# Patient Record
Sex: Male | Born: 1970 | Race: White | Hispanic: No | Marital: Married | Smoking: Never smoker
Health system: Southern US, Community
[De-identification: ages and names within clinical notes are randomized; demographics above are authoritative.]

## PROBLEM LIST (undated history)

## (undated) DIAGNOSIS — F329 Major depressive disorder, single episode, unspecified: Secondary | ICD-10-CM

## (undated) DIAGNOSIS — F32A Depression, unspecified: Secondary | ICD-10-CM

## (undated) DIAGNOSIS — E785 Hyperlipidemia, unspecified: Secondary | ICD-10-CM

## (undated) DIAGNOSIS — K5120 Ulcerative (chronic) proctitis without complications: Secondary | ICD-10-CM

## (undated) HISTORY — DX: Hyperlipidemia, unspecified: E78.5

## (undated) HISTORY — PX: COLONOSCOPY: SHX174

## (undated) HISTORY — DX: Major depressive disorder, single episode, unspecified: F32.9

## (undated) HISTORY — DX: Depression, unspecified: F32.A

## (undated) HISTORY — DX: Ulcerative (chronic) proctitis without complications: K51.20

---

## 2003-08-27 ENCOUNTER — Encounter (INDEPENDENT_AMBULATORY_CARE_PROVIDER_SITE_OTHER): Payer: Self-pay | Admitting: *Deleted

## 2003-08-27 ENCOUNTER — Ambulatory Visit (HOSPITAL_COMMUNITY): Admission: RE | Admit: 2003-08-27 | Discharge: 2003-08-27 | Payer: Self-pay | Admitting: *Deleted

## 2010-05-25 ENCOUNTER — Encounter (INDEPENDENT_AMBULATORY_CARE_PROVIDER_SITE_OTHER): Payer: Self-pay | Admitting: *Deleted

## 2010-07-11 ENCOUNTER — Ambulatory Visit: Payer: Self-pay | Admitting: Gastroenterology

## 2010-07-11 ENCOUNTER — Encounter (INDEPENDENT_AMBULATORY_CARE_PROVIDER_SITE_OTHER): Payer: Self-pay | Admitting: *Deleted

## 2010-07-11 DIAGNOSIS — K219 Gastro-esophageal reflux disease without esophagitis: Secondary | ICD-10-CM | POA: Insufficient documentation

## 2010-10-12 NOTE — Letter (Signed)
Summary: New Patient letter  [facility]  [address] [place], [place] [address]   Phone: [phone]  Fax: [phone]       05/25/2010 MRN: [id number]  [place], [place]  [address]  Dear Mr. [facility],  Welcome to the Gastroenterology Division at [facility]    You are scheduled to see Dr.  Owens Loffler on July 11, 2010 at 3:30pm on the 3rd floor at [facility] [place] [employer].  We ask that you try to arrive at our office 15 minutes prior to your appointment time to allow for check-in.  We would like you to complete the enclosed self-administered evaluation form prior to your visit and bring it with you on the day of your appointment.  We will review it with you.  Also, please bring a complete list of all your medications or, if you prefer, bring the medication bottles and we will list them.  Please bring your insurance card so that we may make a copy of it.  If your insurance requires a referral to see a specialist, please bring your referral form from your primary care physician.  Co-payments are due at the time of your visit and may be paid by cash, check or credit card.     Your office visit will consist of a consult with your physician (includes a physical exam), any laboratory testing he/she may order, scheduling of any necessary diagnostic testing (e.g. x-ray, ultrasound, CT-scan), and scheduling of a procedure (e.g. Endoscopy, Colonoscopy) if required.  Please allow enough time on your schedule to allow for any/all of these possibilities.    If you cannot keep your appointment, please call [phone] to cancel or reschedule prior to your appointment date.  This allows [place] the opportunity to schedule an appointment for another patient in need of care.  If you do not cancel or reschedule by 5 p.m. the business day prior to your appointment date, you will be charged a $50.00 late cancellation/no-show fee.    Thank  you for choosing [facility] for your medical needs.  We appreciate the opportunity to care for you.  Please visit [place] at our website  to learn more about our practice.                     Sincerely,                                                             The Gastroenterology Division

## 2010-10-12 NOTE — Assessment & Plan Note (Signed)
History of Present Illness Visit Type: Initial Visit Primary GI MD: Owens Loffler MD Primary Provider: Christella Noa, MD Chief Complaint: GERD History of Present Illness:      very pleasant 40 year old man who was told his dental enamel was wearing out and thinks that acid may be playing a role. He has NO pyrosis, does have intermittent dyusphagia.  Overall stable weight, maybe up a bit since stopping working out.  No signficant abd or chest pains.    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 He was put on suppository, enemas and his symptoms improved. He has not been on daily medicines for many years and his last flare was about a year ago, treated well with suppository.           Current Medications (verified): 1)  Fluoxetine Hcl 20 Mg Caps (Fluoxetine Hcl) .... Once Daily 2)  Metrogel 1 % Gel (Metronidazole) .... 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  Review of Systems       Pertinent positive and negative review of systems were noted in the above HPI and GI specific review of systems.  All other review of systems was otherwise negative.   Vital Signs:  Patient profile:   40 year old male Height:      75 inches Weight:      216.38 pounds BMI:     27.14 Pulse rate:   64 / minute Pulse rhythm:   regular BP sitting:   152 / 80  (left arm) Cuff size:   regular  Vitals Entered By: June McMurray [place] Deborra Medina) (July 11, 2010 3:27 PM)  Physical Exam  Additional Exam:  Constitutional: generally well appearing Psychiatric: alert and oriented times  3 Eyes: extraocular movements intact Mouth: oropharynx moist, no lesions Neck: supple, no lymphadenopathy Cardiovascular: heart regular rate and rythm Lungs: CTA bilaterally Abdomen: soft, non-tender, non-distended, no obvious ascites, no peritoneal signs, normal bowel sounds Extremities: no lower extremity edema bilaterally Skin: no lesions on visible extremities    Impression & Recommendations:  Problem # 1:  History of proctitis completely colitis and symptoms for least a year. He knows to call if he has a return of proctitis-like symptoms.  Problem # 2:  question GERD his dental enamel is even bloating and there is a question of whether acid could be causing this. He has no more classic acid symptoms at all. It seems unlikely that acid to be eroding his enamel and not creating any type of esophageal, throat, mouth symptoms however certainly silent acid regurgitation is known to occur and so I think we should proceed with EGD at his soonest convenience. His esophagus looks completely normal and I would recommend not starting any antiacid medicines unless more classic acid symptoms arise.  Patient Instructions: 1)  You will be scheduled to have an upper endoscopy. 2)  A copy of this information will be sent to Dr. Larena Glassman (dentist in [place]), Dr. Elesa Hacker. 3)  If proctitis symptoms return, call Dr. Ardis Hughs office. 4)  The medication list was reviewed and reconciled.  All changed /  newly prescribed medications were explained.  A complete medication list was provided to the patient / caregiver.  Appended Document:  patty can you send a copy to Dr. Trecia Rogers (dentist here in [place])  Appended Document: Orders Update/EGD    Clinical Lists Changes  Problems: Added new problem of ESOPHAGEAL REFLUX (ICD-530.81) Orders: Added new Test order of EGD (EGD) - Signed      Appended Document:  sent to transcription

## 2010-10-12 NOTE — Letter (Signed)
Summary: EGD Instructions  [facility]  [place],  [address]   Phone: [phone]  Fax: [phone]       Clio    [birth year]    MRN: [id number]       Procedure Day /Date:08/14/10  MON     Arrival Time:3 pm     Procedure Time:4 pm    Location of Procedure:                    [facility] (4th Floor)  PREPARATION FOR ENDOSCOPY   On12/5/11 THE DAY OF THE PROCEDURE:  1.   No solid foods, milk or milk products are allowed after midnight the night before your procedure.  2.   Do not drink anything colored red or purple.  Avoid juices with pulp.  No orange juice.  3.  You may drink clear liquids until 2 pm , which is 2 hours before your procedure.                                                                                                CLEAR LIQUIDS INCLUDE: Water Jello Ice Popsicles Tea (sugar ok, no milk/cream) Powdered fruit flavored drinks Coffee (sugar ok, no milk/cream) Gatorade Juice: apple, white grape, white cranberry  Lemonade Clear bullion, consomm, broth Carbonated beverages (any kind) Strained chicken noodle soup Hard Candy   MEDICATION INSTRUCTIONS  Unless otherwise instructed, you should take regular prescription medications with a small sip of water as early as possible the morning of your procedure.               OTHER INSTRUCTIONS  You will need a responsible adult at least 41 years of age to accompany you and drive you home.   This person must remain in the waiting room during your procedure.  Wear loose fitting clothing that is easily removed.  Leave jewelry and other valuables at home.  However, you may wish to bring a book to read or an iPod/MP3 player to listen to music as you wait for your procedure to start.  Remove all body piercing jewelry and leave at home.  Total time from sign-in until discharge is approximately 2-3 hours.  You should go home directly after your  procedure and rest.  You can resume normal activities the day after your procedure.  The day of your procedure you should not:   Drive   Make legal decisions   Operate machinery   Drink alcohol   Return to work  You will receive specific instructions about eating, activities and medications before you leave.    The above instructions have been reviewed and explained to me by   _______________________    I fully understand and can verbalize these instructions _____________________________ Date _________

## 2011-12-26 ENCOUNTER — Telehealth: Payer: Self-pay | Admitting: Gastroenterology

## 2011-12-26 NOTE — Telephone Encounter (Signed)
Pt has had bloody diarrhea for 1 month.  His PCP gave him rectal suppositories for ulcerative proctitis.  This has not helped.  Appt made with Amy [place] for 12/17/11 830 am

## 2011-12-27 ENCOUNTER — Other Ambulatory Visit (INDEPENDENT_AMBULATORY_CARE_PROVIDER_SITE_OTHER): Payer: BC Managed Care – PPO

## 2011-12-27 ENCOUNTER — Ambulatory Visit (INDEPENDENT_AMBULATORY_CARE_PROVIDER_SITE_OTHER): Payer: BC Managed Care – PPO | Admitting: Physician Assistant

## 2011-12-27 ENCOUNTER — Encounter: Payer: Self-pay | Admitting: Physician Assistant

## 2011-12-27 VITALS — BP 110/82 | HR 80 | Ht 75.0 in | Wt 213.1 lb

## 2011-12-27 DIAGNOSIS — K5130 Ulcerative (chronic) rectosigmoiditis without complications: Secondary | ICD-10-CM | POA: Insufficient documentation

## 2011-12-27 DIAGNOSIS — K5120 Ulcerative (chronic) proctitis without complications: Secondary | ICD-10-CM

## 2011-12-27 LAB — CBC WITH DIFFERENTIAL/PLATELET
Eosinophils Relative: 4.9 % (ref 0.0–5.0)
HCT: 45.7 % (ref 39.0–52.0)
Hemoglobin: 16 g/dL (ref 13.0–17.0)
Lymphs Abs: 1.6 10*3/uL (ref 0.7–4.0)
Monocytes Relative: 9.4 % (ref 3.0–12.0)
Neutro Abs: 4.1 10*3/uL (ref 1.4–7.7)
WBC: 6.7 10*3/uL (ref 4.5–10.5)

## 2011-12-27 MED ORDER — DICYCLOMINE HCL 10 MG PO CAPS: 10.0000 mg | ORAL_CAPSULE | Freq: Four times a day (QID) | ORAL | Status: DC

## 2011-12-27 MED ORDER — MESALAMINE 1.2 G PO TBEC: DELAYED_RELEASE_TABLET | ORAL | Status: DC

## 2011-12-27 MED ORDER — MESALAMINE 1000 MG RE SUPP: 1000.0000 mg | Freq: Two times a day (BID) | RECTAL | Status: DC

## 2011-12-27 MED ORDER — MOVIPREP 100 G PO SOLR: 1.0000 | Freq: Once | ORAL | Status: AC

## 2011-12-27 NOTE — Patient Instructions (Signed)
Please go to the basement level to have your labs drawn.   We scheduled the Colonoscopy with Dr. Ardis Hughs.. Directions provided. We sent prescriptions for Lialda, Canasa Suppositories, Bentyl for cramping and spasms and the colonoscopy prep to [facility] [address]>

## 2011-12-27 NOTE — Progress Notes (Addendum)
Subjective:    Patient ID: Daniel Bridges, male    DOB: 1971-04-24, 41 y.o.   MRN: [id number]  HPI Daniel Bridges is a pleasant 41 year old white male known to Dr. Ardis Hughs from evaluation in 2011 for reflux symptoms. He underwent upper endoscopy which was consistent with GERD. Patient also has history of ulcerative proctocolitis and had previously been treated by Dr. Vladimir Faster. He last had colonoscopy in 2004 and at that time had active proctitis with rectal biopsy showing findings consistent with ulcerative colitis; there were no well-formed crypt abscesses noted the findings were felt consistent with ulcerative proctocolitis. Patient has been treated over the years with oral  mesalamine and intermittent mesalamine suppositories. He had had quiescent disease over the past couple of years but says over the past 3-4 months he has developed recurrent symptoms, now progressively worse over the past 2 months. He has had some associated abdominal cramping, bloating and a lot of problems with tenesmus and urgency. At this time he is having a 10-12 bowel movements per day generally liquid stool with blood and mucus. He says over the past 2 weeks he hasn't really had any formed stool. He is also having  nighttime episodes of urgency and diarrhea. He says he may have a low-grade fever and has had occasional mild sweats. His appetite has been fair ,his weight is down about 5 pounds. He has no complaints of nausea or vomiting. He has been using his Canasa suppositories which his primary care physician refill once daily without any benefit thus far. He has not had any recent antibiotics.  His family history is positive for an ampulla colon cancer which was initially diagnosed in her 45s.    Review of Systems  Constitutional: Positive for unexpected weight change.  HENT: Negative.   Eyes: Negative.   Respiratory: Negative.   Cardiovascular: Negative.   Gastrointestinal: Positive for abdominal pain, diarrhea and blood  in stool.  Genitourinary: Negative.   Musculoskeletal: Negative.   Neurological: Negative.   Hematological: Negative.   Psychiatric/Behavioral: Negative.    No outpatient prescriptions prior to visit.   No Known Allergies     Patient Active Problem List  Diagnoses  . ESOPHAGEAL REFLUX  . Ulcerative proctocolitis    Objective:   Physical Exam well-developed white male in no acute distress, pleasant- blood pressure 110/80  pulse 80. HEENT; normocephalic. EOMI PERRLA sclera anicteric, Cardiovascular; regular rate and rhythm with S1-S2 no murmur gallop, Pulmonary; clear bilaterally, Abdomen; soft minimally tender in the left lower quadrant there is no guarding no rebound no palpable mass or hepatosplenomegaly bowel sounds are active, Rectal; exam not done, Extremities; no clubbing cyanosis or edema skin warm dry, Psych; mood and affect normal and appropriate        Assessment & Plan:  41 year old male with history of ulcerative proctocolitis recently with exacerbation progressive over the past 2 month now symptomatic with mild abdominal cramping, bloating, tenesmus and 10-12 bloody mucoid stools per day.  Plan; CBC  Start Lialda1.2 g, 2 tablets twice daily Increase Canasa suppositories to twice daily Add Bentyl 10 mg by mouth twice daily as needed for cramping and urgency Schedule for colonoscopy with Dr. Ardis Hughs within the next week or 2. Procedure is discussed in detail with the patient and he is agreeable to proceed.  He may benefit from a course of Uceris, however if  a colonoscopy can be done quickly will wait for colonoscopy findings.  Addendum: Reviewed and agree with management. May require steroids to induce remission,  but will await endoscopic impression.

## 2012-01-04 ENCOUNTER — Encounter: Payer: Self-pay | Admitting: Gastroenterology

## 2012-01-04 ENCOUNTER — Ambulatory Visit (AMBULATORY_SURGERY_CENTER): Payer: BC Managed Care – PPO | Admitting: Gastroenterology

## 2012-01-04 VITALS — BP 151/100 | HR 65 | Temp 97.6°F | Resp 15 | Ht 75.0 in | Wt 213.0 lb

## 2012-01-04 DIAGNOSIS — K5120 Ulcerative (chronic) proctitis without complications: Secondary | ICD-10-CM

## 2012-01-04 DIAGNOSIS — R197 Diarrhea, unspecified: Secondary | ICD-10-CM

## 2012-01-04 DIAGNOSIS — K626 Ulcer of anus and rectum: Secondary | ICD-10-CM

## 2012-01-04 DIAGNOSIS — K625 Hemorrhage of anus and rectum: Secondary | ICD-10-CM

## 2012-01-04 MED ORDER — PREDNISONE 10 MG PO TABS: 40.0000 mg | ORAL_TABLET | Freq: Every day | ORAL | Status: DC

## 2012-01-04 MED ORDER — MESALAMINE 4 G RE ENEM: 4.0000 g | ENEMA | Freq: Every day | RECTAL | Status: DC

## 2012-01-04 MED ORDER — SODIUM CHLORIDE 0.9 % IV SOLN: 500.0000 mL | INTRAVENOUS | Status: DC

## 2012-01-04 NOTE — Op Note (Signed)
[address] [place], [place]  [address]  COLONOSCOPY PROCEDURE REPORT  PATIENT:  Bridges, Daniel  MR#:  [id number] BIRTHDATE:  [birth year], 40 yrs. old  GENDER:  male ENDOSCOPIST:  Milus Banister, MD PROCEDURE DATE:  01/04/2012 PROCEDURE:  Colonoscopy with biopsy ASA CLASS:  Class II INDICATIONS:  ulcerative colitis (limited to rectosigmoid) 2004 Dr. Vladimir Faster, now with bloody diarrhea, urgency again, failing to respond to PO mesalamine, canasa MEDICATIONS:   Fentanyl 75 mcg IV, These medications were titrated to patient response per physician's verbal order, Versed 8 mg IV  DESCRIPTION OF PROCEDURE:   After the risks benefits and alternatives of the procedure were thoroughly explained, informed consent was obtained.  Digital rectal exam was performed and revealed no rectal masses.   The LB CF-[id number] endoscope was introduced through the anus and advanced to the cecum, which was identified by both the appendix and ileocecal valve, without limitations.  The quality of the prep was good..  The instrument was then slowly withdrawn as the colon was fully examined. <<PROCEDUREIMAGES>> FINDINGS: The rectosigmoid was severely inflamed, this tapered gradualy to normal mucosa at 25cm from anus. Biopsies taken and sent to pathology (jar 1) (see image1, image2, and image3).  This was otherwise a normal examination of the colon (see image4).  The terminal ileum appeared normal (see image5).   Retroflexed views in the rectum revealed no abnormalities. COMPLICATIONS:  None  ENDOSCOPIC IMPRESSION: 1) Recto-sigmoid severely inflamed to 25cm from anus.  Biopsies taken. 2) Otherwise normal examination 3) Normal terminal ileum  RECOMMENDATIONS: Please start prednisone 13m once daily. Continue on lialda 4 pills once daily. Stop canasa suppositories and instead use rowasa enema once nightly. Dr. JArdis Hughs office will call to set up return visit in  2-3 weeks.  ______________________________ [name] MD  n. eSIGNED:   DMilus Banisterat 01/04/2012 03:51 PM  MSteward Ros [phone]

## 2012-01-04 NOTE — Patient Instructions (Signed)
Discharge instructions given with verbal understanding. YOU HAD AN ENDOSCOPIC PROCEDURE TODAY AT THE [facility]: Refer to the procedure report that was given to you for any specific questions about what was found during the examination.  If the procedure report does not answer your questions, please call your gastroenterologist to clarify.  If you requested that your care partner not be given the details of your procedure findings, then the procedure report has been included in a sealed envelope for you to review at your convenience later.  YOU SHOULD EXPECT: Some feelings of bloating in the abdomen. Passage of more gas than usual.  Walking can help get rid of the air that was put into your GI tract during the procedure and reduce the bloating. If you had a lower endoscopy (such as a colonoscopy or flexible sigmoidoscopy) you may notice spotting of blood in your stool or on the toilet paper. If you underwent a bowel prep for your procedure, then you may not have a normal bowel movement for a few days.  DIET: Your first meal following the procedure should be a light meal and then it is ok to progress to your normal diet.  A half-sandwich or bowl of soup is an example of a good first meal.  Heavy or fried foods are harder to digest and may make you feel nauseous or bloated.  Likewise meals heavy in dairy and vegetables can cause extra gas to form and this can also increase the bloating.  Drink plenty of fluids but you should avoid alcoholic beverages for 24 hours.  ACTIVITY: Your care partner should take you home directly after the procedure.  You should plan to take it easy, moving slowly for the rest of the day.  You can resume normal activity the day after the procedure however you should NOT DRIVE or use heavy machinery for 24 hours (because of the sedation medicines used during the test).    SYMPTOMS TO REPORT IMMEDIATELY: A gastroenterologist can be reached at any hour.  During normal  business hours, 8:30 AM to 5:00 PM Monday through Friday, call [phone].  After hours and on weekends, please call the GI answering service at [phone] who will take a message and have the physician on call contact you.   Following lower endoscopy (colonoscopy or flexible sigmoidoscopy):  Excessive amounts of blood in the stool  Significant tenderness or worsening of abdominal pains  Swelling of the abdomen that is new, acute  Fever of 100F or higher  FOLLOW UP: If any biopsies were taken you will be contacted by phone or by letter within the next 1-3 weeks.  Call your gastroenterologist if you have not heard about the biopsies in 3 weeks.  Our staff will call the home number listed on your records the next business day following your procedure to check on you and address any questions or concerns that you may have at that time regarding the information given to you following your procedure. This is a courtesy call and so if there is no answer at the home number and we have not heard from you through the emergency physician on call, we will assume that you have returned to your regular daily activities without incident.  SIGNATURES/CONFIDENTIALITY: You and/or your care partner have signed paperwork which will be entered into your electronic medical record.  These signatures attest to the fact that that the information above on your After Visit Summary has been reviewed and is understood.  Full   responsibility of the confidentiality of this discharge information lies with you and/or your care-partner.  

## 2012-01-04 NOTE — Progress Notes (Signed)
Patient did not experience any of the following events: a burn prior to discharge; a fall within the facility; wrong site/side/patient/procedure/implant event; or a hospital transfer or hospital admission upon discharge from the facility. (G8907) Patient did not have preoperative order for IV antibiotic SSI prophylaxis. (G8918)  

## 2012-01-07 ENCOUNTER — Telehealth: Payer: Self-pay

## 2012-01-07 NOTE — Telephone Encounter (Signed)
  Follow up Call-  Call back number 01/04/2012  Post procedure Call Back phone  # [phone]  Permission to leave phone message Yes     Patient questions:  Do you have a fever, pain , or abdominal swelling? no Pain Score  0 *  Have you tolerated food without any problems? yes  Have you been able to return to your normal activities? yes  Do you have any questions about your discharge instructions: Diet   no Medications  no Follow up visit  no  Do you have questions or concerns about your Care? no  Actions: * If pain score is 4 or above: No action needed, pain <4.

## 2012-01-25 ENCOUNTER — Ambulatory Visit (INDEPENDENT_AMBULATORY_CARE_PROVIDER_SITE_OTHER): Payer: BC Managed Care – PPO | Admitting: Gastroenterology

## 2012-01-25 ENCOUNTER — Encounter: Payer: Self-pay | Admitting: Gastroenterology

## 2012-01-25 VITALS — BP 120/72 | HR 60 | Ht 75.0 in | Wt 213.0 lb

## 2012-01-25 DIAGNOSIS — K5120 Ulcerative (chronic) proctitis without complications: Secondary | ICD-10-CM

## 2012-01-25 NOTE — Patient Instructions (Signed)
Start decreasing the prednisone by 16m every week, until off completely.   Return to see Dr. JArdis Hughsin 6 weeks.   Continue nightly enemas and lialda (4 pills once a day).

## 2012-01-25 NOTE — Progress Notes (Signed)
Review of pertinent gastrointestinal problems: 1. left sided ulcerative colitis.  Dr. Vladimir Faster colonoscopy in 2004showed active proctitis with rectal biopsy showing findings consistent with ulcerative colitis; there were no well-formed crypt abscesses noted the findings were felt consistent with ulcerative proctocolitis. Patient has been treated over the years with oral mesalamine and intermittent mesalamine suppositories. Flared with bleeding, urgency, frequency Spring 2013.  Colonoscopy April 2013 showed moderate to severe inflammation up to 25 cm. Biopsies confirmed chronic inflammation. Terminal ileum was normal. He was started on 40 mg prednisone, mesalamine enema (continued on mesalamine orally).  May, 2013: Symptoms improved very quickly on prednisone.  HPI: This is a very pleasant 41 year old man whom I last saw the time of a colonoscopy about 3 weeks ago see those results summarized above.  Very quick improvement after he started prednisone 40 mg once daily.  Frequency is from 12 times a day down to 1-2 a day.  No bleeding.  Back to normal.  Eating a lot, insomnic with prednisone.  He is also on mesalamine enema, mesalamine orally.   Past Medical History  Diagnosis Date  . Ulcerative proctitis   . Hyperlipidemia   . Depression     No past surgical history on file.  Current Outpatient Prescriptions  Medication Sig Dispense Refill  . aspirin 81 MG tablet Take 81 mg by mouth daily.      Marland Kitchen dicyclomine (BENTYL) 10 MG capsule Take 1 capsule (10 mg total) by mouth 4 (four) times daily.  120 capsule  0  . FLUoxetine (PROZAC) 20 MG tablet Take 20 mg by mouth daily.      . mesalamine (LIALDA) 1.2 G EC tablet Take 2 tablets twice daily  60 tablet  3  . mesalamine (ROWASA) 4 G enema Place 60 mLs (4 g total) rectally at bedtime.  30 Bottle  3  . metroNIDAZOLE (METROGEL) 1 % gel Apply 1 application topically daily.      . Omega-3 Fatty Acids (FISH OIL) 1000 MG CAPS Take 1 capsule by mouth  daily.      . predniSONE (DELTASONE) 10 MG tablet As directed        Allergies as of 01/25/2012  . (No Known Allergies)    Family History  Problem Relation Age of Onset  . Colon cancer Neg Hx   . Kidney cancer Mother   . Hypertension Mother   . Aneurysm Father     abdominal aortic    History   Social History  . Marital Status: Married    Spouse Name: N/A    Number of Children: 2  . Years of Education: N/A   Occupational History  . Media planner    Social History Main Topics  . Smoking status: Never Smoker   . Smokeless tobacco: Never Used  . Alcohol Use: Yes     2 per day   . Drug Use: No  . Sexually Active: Not on file   Other Topics Concern  . Not on file   Social History Narrative  . No narrative on file      Physical Exam: BP 120/72  Pulse 60  Ht 6' 3"  (1.905 m)  Wt 213 lb (96.616 kg)  BMI 26.62 kg/m2 Constitutional: generally well-appearing Psychiatric: alert and oriented x3 Abdomen: soft, nontender, nondistended, no obvious ascites, no peritoneal signs, normal bowel sounds     Assessment and plan: 41 y.o. male with ulcerative proctocolitis  He is improving very well on prednisone 40 mg once daily, mesalamine enema, mesalamine  orally. He is going to begin tapering her prednisone by 5 mg every week. She will continue on both mesalamine products for now. He'll return to see me in 6 weeks and sooner if needed.

## 2012-02-20 ENCOUNTER — Other Ambulatory Visit: Payer: Self-pay

## 2012-02-20 MED ORDER — MESALAMINE 1.2 G PO TBEC: DELAYED_RELEASE_TABLET | ORAL | Status: DC

## 2012-03-05 ENCOUNTER — Encounter: Payer: Self-pay | Admitting: Gastroenterology

## 2012-03-05 ENCOUNTER — Ambulatory Visit (INDEPENDENT_AMBULATORY_CARE_PROVIDER_SITE_OTHER): Payer: BC Managed Care – PPO | Admitting: Gastroenterology

## 2012-03-05 VITALS — BP 126/72 | HR 80 | Ht 75.0 in | Wt 213.0 lb

## 2012-03-05 DIAGNOSIS — K5120 Ulcerative (chronic) proctitis without complications: Secondary | ICD-10-CM

## 2012-03-05 NOTE — Progress Notes (Signed)
Review of pertinent gastrointestinal problems:  1. left sided ulcerative colitis. Dr. Vladimir Faster colonoscopy in 2004showed active proctitis with rectal biopsy showing findings consistent with ulcerative colitis; there were no well-formed crypt abscesses noted the findings were felt consistent with ulcerative proctocolitis. Patient has been treated over the years with oral mesalamine and intermittent mesalamine suppositories. Flared with bleeding, urgency, frequency Spring 2013. Colonoscopy April 2013 showed moderate to severe inflammation up to 25 cm. Biopsies confirmed chronic inflammation. Terminal ileum was normal. He was started on 40 mg prednisone, mesalamine enema (continued on mesalamine orally). May, 2013: Symptoms improved very quickly on prednisone.   HPI: This is a  Very pleasant 41 yo man, I last saw him about 6 weeks ago. Since then he has been tapering his steroids. He continues on full dose mesalamine and nightly rectal mesalamine enemas.  No troubles taperintg steroids.  On 10 mg today, starting 5 tomorrow.     Past Medical History  Diagnosis Date  . Ulcerative proctitis   . Hyperlipidemia   . Depression     History reviewed. No pertinent past surgical history.  Current Outpatient Prescriptions  Medication Sig Dispense Refill  . aspirin 81 MG tablet Take 81 mg by mouth daily.      Marland Kitchen FLUoxetine (PROZAC) 20 MG tablet Take 20 mg by mouth daily.      . mesalamine (LIALDA) 1.2 G EC tablet Take 2 tablets twice daily  60 tablet  3  . mesalamine (ROWASA) 4 G enema Place 60 mLs (4 g total) rectally at bedtime.  30 Bottle  3  . metroNIDAZOLE (METROGEL) 1 % gel Apply 1 application topically daily.      . Omega-3 Fatty Acids (FISH OIL) 1000 MG CAPS Take 1 capsule by mouth daily.      . predniSONE (DELTASONE) 10 MG tablet As directed        Allergies as of 03/05/2012  . (No Known Allergies)    Family History  Problem Relation Age of Onset  . Colon cancer Neg Hx   . Kidney  cancer Mother   . Hypertension Mother   . Aneurysm Father     abdominal aortic    History   Social History  . Marital Status: Married    Spouse Name: N/A    Number of Children: 2  . Years of Education: N/A   Occupational History  . Media planner    Social History Main Topics  . Smoking status: Never Smoker   . Smokeless tobacco: Never Used  . Alcohol Use: Yes     2 per day   . Drug Use: No  . Sexually Active: Not on file   Other Topics Concern  . Not on file   Social History Narrative  . No narrative on file      Physical Exam: BP 126/72  Pulse 80  Ht 6' 3"  (1.905 m)  Wt 213 lb (96.616 kg)  BMI 26.62 kg/m2 Constitutional: generally well-appearing Psychiatric: alert and oriented x3 Abdomen: soft, nontender, nondistended, no obvious ascites, no peritoneal signs, normal bowel sounds     Assessment and plan: 41 y.o. male with ulcerative proctitis, left-sided colitis to 25 cm  He is doing well as he tapers off prednisone. He will maintain on mesalamine orally and rectally GERD we will begin taper his enemas in about 4-5 weeks and he'll return to see me in 2 months.

## 2012-03-05 NOTE — Patient Instructions (Addendum)
Continue tapering prednisone, should be off completely in 8 days. 4 weeks after off prednisone, then decrease enema to every other night. Return to see Dr. Ardis Hughs in 2 months. The goal is pills only, perhaps enemas every 3-4 nights or only as needed.

## 2012-03-11 ENCOUNTER — Other Ambulatory Visit: Payer: Self-pay | Admitting: Gastroenterology

## 2012-03-11 NOTE — Telephone Encounter (Signed)
Do you want to refill per your last note pt should be off Prednisone?

## 2012-06-18 ENCOUNTER — Other Ambulatory Visit: Payer: Self-pay | Admitting: Gastroenterology

## 2012-06-18 ENCOUNTER — Other Ambulatory Visit: Payer: Self-pay | Admitting: Physician Assistant

## 2012-11-20 ENCOUNTER — Other Ambulatory Visit: Payer: Self-pay | Admitting: Gastroenterology

## 2012-12-27 ENCOUNTER — Other Ambulatory Visit: Payer: Self-pay | Admitting: Gastroenterology

## 2013-02-06 ENCOUNTER — Other Ambulatory Visit: Payer: Self-pay | Admitting: Gastroenterology

## 2013-03-11 ENCOUNTER — Telehealth: Payer: Self-pay | Admitting: Gastroenterology

## 2013-03-11 ENCOUNTER — Other Ambulatory Visit: Payer: Self-pay | Admitting: Gastroenterology

## 2013-03-11 NOTE — Telephone Encounter (Signed)
Patient reports six weeks of worsening symptoms.  He has urgency and blood in stool.  He has been off of his Lialda for approx a week.  He will come in and see Tye Savoy RNP tomorrow at 1:30

## 2013-03-12 ENCOUNTER — Ambulatory Visit (INDEPENDENT_AMBULATORY_CARE_PROVIDER_SITE_OTHER): Payer: BC Managed Care – PPO | Admitting: Nurse Practitioner

## 2013-03-12 ENCOUNTER — Other Ambulatory Visit (INDEPENDENT_AMBULATORY_CARE_PROVIDER_SITE_OTHER): Payer: BC Managed Care – PPO

## 2013-03-12 ENCOUNTER — Encounter: Payer: Self-pay | Admitting: Nurse Practitioner

## 2013-03-12 VITALS — BP 120/80 | HR 68 | Ht 75.0 in | Wt 207.5 lb

## 2013-03-12 DIAGNOSIS — K5120 Ulcerative (chronic) proctitis without complications: Secondary | ICD-10-CM

## 2013-03-12 DIAGNOSIS — K51211 Ulcerative (chronic) proctitis with rectal bleeding: Secondary | ICD-10-CM

## 2013-03-12 LAB — CBC WITH DIFFERENTIAL/PLATELET
Basophils Relative: 0.1 % (ref 0.0–3.0)
Eosinophils Relative: 0 % (ref 0.0–5.0)
HCT: 47.9 % (ref 39.0–52.0)
Lymphs Abs: 1.1 10*3/uL (ref 0.7–4.0)
MCV: 84.7 fl (ref 78.0–100.0)
Monocytes Absolute: 0.1 10*3/uL (ref 0.1–1.0)
Monocytes Relative: 1 % — ABNORMAL LOW (ref 3.0–12.0)
RBC: 5.65 Mil/uL (ref 4.22–5.81)
WBC: 14.1 10*3/uL — ABNORMAL HIGH (ref 4.5–10.5)

## 2013-03-12 MED ORDER — MESALAMINE 1.2 G PO TBEC: 1200.0000 mg | DELAYED_RELEASE_TABLET | Freq: Two times a day (BID) | ORAL | Status: DC

## 2013-03-12 MED ORDER — BUDESONIDE 9 MG PO TB24: 1.0000 | ORAL_TABLET | ORAL | Status: DC

## 2013-03-12 NOTE — Patient Instructions (Addendum)
Your physician has requested that you go to the basement for the following lab work before leaving today: CBC, C Diff by PCR  We have sent the following medications to your pharmacy for you to pick up at your convenience: Brenda have been scheduled for a follow up with Dr Owens Loffler on Monday, 04/13/13 @ 9:00 am.  Cc: Dr Orpah Melter, Dr Owens Loffler

## 2013-03-12 NOTE — Progress Notes (Signed)
  History of Present Illness:  Patient is a 42 year old male with a history of left sided ulcerative colitis diagnosed 2004. Last colonoscopy late April 2013 showed severe inflammation in rectosigmoid colon tapering down to normal mucosa at 25 cm from the anus. Patient had been on oral mesalamine. Post colonoscopy he was started on mesalamine enemas and oral prednisone. At the time of his followup visit in mid may patient was feeling much better. His prednisone dose was tapered , he was kept on Lialda and nightly enemas. By late June patient was feeling fine prednisone. He was advised to continue oral mesalamine, his enemas were tapered over a 4-5 week period. Patient did well for several months but then around Christmas he began having recurrent bloody stools. Over the last two months the frequency of his loose bloody stools have increased . No abodminal pain, some bloating. Appetite okay. Ran out of Lialda two weeks ago. No antibiotics in months.   Current Medications, Allergies, Past Medical History, Past Surgical History, Family History and Social History were reviewed in [facility] record.  Physical Exam: General: Well developed , white male in no acute distress Head: Normocephalic and atraumatic Eyes:  sclerae anicteric, conjunctiva pink  Ears: Normal auditory acuity Lungs: Clear throughout to auscultation Heart: Regular rate and rhythm Abdomen: Soft, non distended, mild mid lower abdominal tenderness. No masses, no hepatomegaly. Normal bowel sounds Musculoskeletal: Symmetrical with no gross deformities  Extremities: No edema  Neurological: Alert oriented x 4, grossly nonfocal Psychological:  Alert and cooperative. Normal mood and affect  Assessment and Recommendations:  ulcerative proctitis, on Lialda 4.8 g daily. Now with increasing amounts of bloody stools over last 6 months. I don't know that adding back topical treatment will suffice at this point. Plan is to  obtain a CBC, rule out superimposed Cdiff, then start Uceris 37m daily (continue until follow up appointment). Patient will return to see his primary gastroenterologist, Dr. JArdis Hughs in 3-4 weeks. He knows to call in the interim should  symptoms not improve or become progressively worse

## 2013-03-14 NOTE — Progress Notes (Signed)
i agree with the plan outlined in this note

## 2013-03-16 ENCOUNTER — Other Ambulatory Visit: Payer: Self-pay | Admitting: *Deleted

## 2013-03-16 MED ORDER — MESALAMINE 1.2 G PO TBEC: DELAYED_RELEASE_TABLET | ORAL | Status: DC

## 2013-03-18 ENCOUNTER — Other Ambulatory Visit: Payer: BC Managed Care – PPO

## 2013-03-18 DIAGNOSIS — K51211 Ulcerative (chronic) proctitis with rectal bleeding: Secondary | ICD-10-CM

## 2013-03-19 LAB — CLOSTRIDIUM DIFFICILE BY PCR: Toxigenic C. Difficile by PCR: NOT DETECTED

## 2013-04-01 ENCOUNTER — Encounter: Payer: Self-pay | Admitting: Nurse Practitioner

## 2013-04-02 ENCOUNTER — Encounter: Payer: Self-pay | Admitting: Nurse Practitioner

## 2013-04-02 ENCOUNTER — Ambulatory Visit (INDEPENDENT_AMBULATORY_CARE_PROVIDER_SITE_OTHER): Payer: BC Managed Care – PPO | Admitting: Nurse Practitioner

## 2013-04-02 VITALS — BP 120/82 | HR 60 | Ht 75.0 in | Wt 213.6 lb

## 2013-04-02 DIAGNOSIS — K5120 Ulcerative (chronic) proctitis without complications: Secondary | ICD-10-CM

## 2013-04-02 DIAGNOSIS — K51211 Ulcerative (chronic) proctitis with rectal bleeding: Secondary | ICD-10-CM

## 2013-04-02 MED ORDER — PREDNISONE 20 MG PO TABS: 20.0000 mg | ORAL_TABLET | ORAL | Status: DC

## 2013-04-02 MED ORDER — MESALAMINE 4 G RE ENEM: 4.0000 g | ENEMA | Freq: Every day | RECTAL | Status: DC

## 2013-04-02 MED ORDER — ALPRAZOLAM 0.25 MG PO TABS: 0.2500 mg | ORAL_TABLET | Freq: Three times a day (TID) | ORAL | Status: DC | PRN

## 2013-04-02 NOTE — Patient Instructions (Signed)
Rowasa enema at night. Continue nightly until next appointment with Dr. Lurline Hare will be started on Prednisone. Take as directed below: Prednisone tapering schedule with 10 mg tablets:  40 mg (4 tablets) daily for 5 days followed by  35 mg (3 1/2 tablets) daily for 5 days followed by 30 mg (3 tablets) daily for 5 days followed by  25 mg (2 1/2 tablets) daily for 5 days followed by 20 mg (2 tablets) daily for 5 days followed by 10 mg (2 tablets) daily for 5 days followed by 5 mg  (1/2 tablet) daily for 5 days then discontinue.  You have been given a prescription for Xanax to take as needed for Prednisone side effects. Do not consume alcohol or operate machinery while taking this medication.   Continue current dose of Lialda  Keep follow appointment with Dr. Ardis Hughs.04/13/13 at 9:00am

## 2013-04-03 ENCOUNTER — Encounter: Payer: Self-pay | Admitting: Nurse Practitioner

## 2013-04-03 NOTE — Progress Notes (Signed)
  History of Present Illness:  Patient is a 42 year old male with a history of left sided ulcerative colitis maintained on lialda.  I saw him on the 3rd of this month for a several month history of bloody loose stools, progressive over the previous couple of months. C-diff returned negative. Patient was started on Uceris and his symptoms began to improve. Unfortunately the bloody diarrhea did not resolve and in fact has gotten worse of the last few days. No significant abdominal pain. No fevers. No arthralgia.   Current Medications, Allergies, Past Medical History, Past Surgical History, Family History and Social History were reviewed in [facility] record.  Physical Exam: General: Well developed , white male in no acute distress Head: Normocephalic and atraumatic Eyes:  sclerae anicteric, conjunctiva pink  Ears: Normal auditory acuity Lungs: Clear throughout to auscultation Heart: Regular rate and rhythm Abdomen: Soft, non distended, non-tender. No masses, no hepatomegaly. Normal bowel sounds Musculoskeletal: Symmetrical with no gross deformities  Extremities: No edema  Neurological: Alert oriented x 4, grossly nonfocal Psychological:  Alert and cooperative. Normal mood and affect  Assessment and Recommendations:  75. 42 year old male with ulcerative proctitis. Patient was doing well on Lialda until around Christmas time when he began having recurrent bloody diarrhea. Recent C. difficile is negative. Patient is having recurrent symptoms on the Euceris which was started 03/13/13. Overall patient looks well, abdominal exam is not concerning. Patient may need escalation of UC treatment in the near future but will defer that to patient's primary GI, Dr. Ardis Hughs. For now, will discontinue Euceris and begin slow prednisone taper starting at 97m and decreasing dose by 5 mg every 5 days. In addition, will add nightly rowasa enemas. Patient reports restlessness/ jitters with  prednisone. Will give him low dose Xanax to take as needed. Patient already has a follow up with Dr. JArdis Hughsearly next month.  He knows to call in the interim should symptoms worsen

## 2013-04-05 NOTE — Progress Notes (Signed)
I agree with this plan.

## 2013-04-06 NOTE — Telephone Encounter (Signed)
Error

## 2013-04-13 ENCOUNTER — Telehealth: Payer: Self-pay

## 2013-04-13 ENCOUNTER — Ambulatory Visit (INDEPENDENT_AMBULATORY_CARE_PROVIDER_SITE_OTHER): Payer: BC Managed Care – PPO | Admitting: Gastroenterology

## 2013-04-13 ENCOUNTER — Encounter: Payer: Self-pay | Admitting: Gastroenterology

## 2013-04-13 VITALS — BP 128/80 | HR 76 | Ht 75.0 in | Wt 213.2 lb

## 2013-04-13 DIAGNOSIS — K6289 Other specified diseases of anus and rectum: Secondary | ICD-10-CM

## 2013-04-13 MED ORDER — PREDNISONE 10 MG PO TABS: 10.0000 mg | ORAL_TABLET | ORAL | Status: DC

## 2013-04-13 MED ORDER — MESALAMINE 1.2 G PO TBEC: 4.8000 g | DELAYED_RELEASE_TABLET | Freq: Two times a day (BID) | ORAL | Status: DC

## 2013-04-13 MED ORDER — MESALAMINE 4 G RE ENEM: 4.0000 g | ENEMA | Freq: Every day | RECTAL | Status: DC

## 2013-04-13 NOTE — Telephone Encounter (Signed)
[facility] called to confirm Lialda 1.2 dosing:  Take 4 tablets twice a day per Christian Mate, CMA.

## 2013-04-13 NOTE — Progress Notes (Signed)
Review of pertinent gastrointestinal problems:  1. left sided ulcerative colitis. Dr. 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 Patient has been treated over the years with oral mesalamine and intermittent mesalamine suppositories. Flared with bleeding, urgency, frequency Spring 2013. Colonoscopy April 2013 showed moderate to severe inflammation up to 25 cm. Biopsies confirmed chronic inflammation. Terminal ileum was normal. He was started on 40 mg prednisone, mesalamine enema (continued on mesalamine orally). May, 2013: Symptoms improved very quickly on prednisone.  04/2013 Flare of symptoms beginning winder 2013/14; saw extender 03/2013; c. Diff negative, started Euceris, continued oral mesalamine; 3 weeks later symptoms a bit worse and put on prednisone 40 with taper, continue oral mesalamine, added rowasa.  HPI: This is a  very pleasant 42 year old man whom I last saw about a year ago. In the past month or so he has seen Nevin Bloodgood, one of our extender is here in the office. He has been having what is likely a flare of his left sided colitis.  He is doing much better.  Currently still on prednisone 19m per day. He is taking Rowasa enemas nightly. He is still on oral mesalamine full-strength. A bit more restless, more productive at home.  Not sleeping too well.  Since changing to prednisone his bleeding is gone.  Moving his bowels normally. No urgency at all.    Past Medical History  Diagnosis Date  . Ulcerative proctitis   . Hyperlipidemia   . Depression     History reviewed. No pertinent past surgical history.  Current Outpatient Prescriptions  Medication Sig Dispense Refill  . aspirin 81 MG tablet Take 81 mg by mouth daily.      .Marland KitchenFLUoxetine (PROZAC) 20 MG tablet Take 20 mg by mouth daily.      .  fluticasone (FLONASE) 50 MCG/ACT nasal spray       . mesalamine (LIALDA) 1.2 G EC tablet Take 2 tablets by mouth twice daily  120 tablet  1  . mesalamine (ROWASA) 4 G enema Place 60 mLs (4 g total) rectally at bedtime.  30 Bottle  1  . metroNIDAZOLE (METROGEL) 1 % gel Apply 1 application topically daily.      . predniSONE (DELTASONE) 20 MG tablet Take 1 tablet (20 mg total) by mouth as directed.  100 tablet  1   No current facility-administered medications for this visit.    Allergies as of 04/13/2013  . (No Known Allergies)    Family History  Problem Relation Age of Onset  . Colon cancer Neg Hx   . Kidney cancer Mother   . Hypertension Mother   . Aneurysm Father     abdominal aortic    History   Social History  . Marital Status: Married    Spouse Name: N/A    Number of Children: 2  . Years of Education: N/A   Occupational History  . tMedia planner   Social History Main Topics  . Smoking status: Never Smoker   . Smokeless tobacco: Never Used  . Alcohol Use: Yes     Comment: 2 per day   . Drug Use: No  . Sexually Active: Not on file   Other Topics Concern  . Not on file   Social History Narrative  . No narrative on file      Physical Exam: BP 128/80  Pulse 76  Ht  6' 3"  (1.905 m)  Wt 213 lb 4 oz (96.73 kg)  BMI 26.65 kg/m2 Constitutional: generally well-appearing Psychiatric: alert and oriented x3 Abdomen: soft, nontender, nondistended, no obvious ascites, no peritoneal signs, normal bowel sounds     Assessment and plan: 42 y.o. male with left-sided colitis, flaring  He will continue on prednisone but start tapering the medicine by 5 mg every week. He will continue on liadla full-strength as well as Rowasa enema nightly. My goal will be to get him off prednisone over the next one to 2 months, he will continue Rowasa enemas nightly for 2-3 months after that point and then I will try tapering his Rowasa enemas very slowly. All the while we'll continue  his mesalamine orally  full-strength. He'll return to see me in 2 months, sooner if needed

## 2013-04-13 NOTE — Patient Instructions (Addendum)
Start prednisone taper, by 15m every week. Please return to see Dr. JArdis Hughsin 2 months. Continue lialda 4 pills once daily (or two pills twice daily). Continue enemas nightly (longer term goal will to taper this very slowly, probably only to every third day). Refills for one year given.                                               We are excited to introduce MyChart, a new best-in-class service that provides you online access to important information in your electronic medical record. We want to make it easier for you to view your health information - all in one secure location - when and where you need it. We expect MyChart will enhance the quality of care and service we provide.  When you register for MyChart, you can:    View your test results.    Request appointments and receive appointment reminders via email.    Request medication renewals.    View your medical history, allergies, medications and immunizations.    Communicate with your physician's office through a password-protected site.    Conveniently print information such as your medication lists.  To find out if MyChart is right for you, please talk to a member of our clinical staff today. We will gladly answer your questions about this free health and wellness tool.  If you are age 5247or older and want a member of your family to have access to your record, you must provide written consent by completing a proxy form available at our office. Please speak to our clinical staff about guidelines regarding accounts for patients younger than age 43  As you activate your MyChart account and need any technical assistance, please call the MyChart technical support line at [phone] [phone] or email your question to [email or web address]. If you email your question(s), please include your name, a return phone number and the best time to reach you.  If you have non-urgent health-related questions, you can send a  message to our office through [email or web address] If you have a medical emergency, call 911.  Thank you for using MyChart as your new health and wellness resource!   MyChart licensed from [employer]  1999-2010. Patents Pending.

## 2013-05-13 ENCOUNTER — Encounter (HOSPITAL_COMMUNITY): Payer: Self-pay

## 2013-05-13 ENCOUNTER — Emergency Department (HOSPITAL_COMMUNITY)
Admission: EM | Admit: 2013-05-13 | Discharge: 2013-05-13 | Disposition: A | Discharge status: Home / Self Care | Payer: BC Managed Care – PPO | Attending: Emergency Medicine | Admitting: Emergency Medicine

## 2013-05-13 DIAGNOSIS — Z209 Contact with and (suspected) exposure to unspecified communicable disease: Secondary | ICD-10-CM

## 2013-05-13 DIAGNOSIS — K5120 Ulcerative (chronic) proctitis without complications: Secondary | ICD-10-CM | POA: Insufficient documentation

## 2013-05-13 DIAGNOSIS — Z7982 Long term (current) use of aspirin: Secondary | ICD-10-CM | POA: Insufficient documentation

## 2013-05-13 DIAGNOSIS — Z792 Long term (current) use of antibiotics: Secondary | ICD-10-CM | POA: Insufficient documentation

## 2013-05-13 DIAGNOSIS — F329 Major depressive disorder, single episode, unspecified: Secondary | ICD-10-CM | POA: Insufficient documentation

## 2013-05-13 DIAGNOSIS — Z8639 Personal history of other endocrine, nutritional and metabolic disease: Secondary | ICD-10-CM | POA: Insufficient documentation

## 2013-05-13 DIAGNOSIS — IMO0002 Reserved for concepts with insufficient information to code with codable children: Secondary | ICD-10-CM | POA: Insufficient documentation

## 2013-05-13 DIAGNOSIS — Z203 Contact with and (suspected) exposure to rabies: Secondary | ICD-10-CM | POA: Insufficient documentation

## 2013-05-13 DIAGNOSIS — F3289 Other specified depressive episodes: Secondary | ICD-10-CM | POA: Insufficient documentation

## 2013-05-13 DIAGNOSIS — Z862 Personal history of diseases of the blood and blood-forming organs and certain disorders involving the immune mechanism: Secondary | ICD-10-CM | POA: Insufficient documentation

## 2013-05-13 DIAGNOSIS — Z79899 Other long term (current) drug therapy: Secondary | ICD-10-CM | POA: Insufficient documentation

## 2013-05-13 MED ORDER — RABIES VACCINE, PCEC IM SUSR
1.0000 mL | Freq: Once | INTRAMUSCULAR | Status: AC
  Administered 2013-05-13: 1 mL via INTRAMUSCULAR
  Filled 2013-05-13: qty 1

## 2013-05-13 MED ORDER — RABIES IMMUNE GLOBULIN 150 UNIT/ML IM INJ
20.0000 [IU]/kg | INJECTION | Freq: Once | INTRAMUSCULAR | Status: AC
  Administered 2013-05-13: 1950 [IU] via INTRAMUSCULAR
  Filled 2013-05-13: qty 13

## 2013-05-13 NOTE — ED Provider Notes (Signed)
Medical screening examination/treatment/procedure(s) were performed by non-physician practitioner and as supervising physician I was immediately available for consultation/collaboration.  Leota Jacobsen, MD 05/13/13 2312

## 2013-05-13 NOTE — ED Notes (Addendum)
Pt asking for rabies vaccination after finding bats in house.  Sts bats were exterminated but were not tested for rabies.  [place] suggested getting the vaccine.

## 2013-05-13 NOTE — ED Provider Notes (Signed)
CSN: [id number]     Arrival date & time 05/13/13  1612 History  This chart was scribed for Daniel Bridges, [place], working with Daniel Jacobsen, MD by Daniel Bridges, ED Scribe. This patient was seen in room WTR8/WTR8 and the patient's care was started at 5:35 PM.    Chief Complaint  Patient presents with  . rabies vaccine     The history is provided by the patient and a significant other. No language interpreter was used.    HPI Comments: Daniel Bridges is a 42 y.o. male who presents to the Emergency Department due to a bat infestation in house over the weekend (3 days ago) and potential rabies exposure. The family was alerted to the bats by their golden retriever. The first bat was trapped by [employer] 3 days ago and was taken to [facility]. The second bat appeared the next night and was treated the same as the first. Upon calling Animal control testing today they denied having a record of the bats. The patient suspects the bats were never turned in for testing and does not know if the bats have rabies. The patient denies suspicion of any bites but the first bat had access to a sleeping quarter. Patient denies fever, chills, myalgia, vomiting, abdominal pain, cough or muscle spasms.    Past Medical History  Diagnosis Date  . Ulcerative proctitis   . Hyperlipidemia   . Depression    History reviewed. No pertinent past surgical history. Family History  Problem Relation Age of Onset  . Colon cancer Neg Hx   . Kidney cancer Mother   . Hypertension Mother   . Aneurysm Father     abdominal aortic   History  Substance Use Topics  . Smoking status: Never Smoker   . Smokeless tobacco: Never Used  . Alcohol Use: Yes     Comment: 2 per day     Review of Systems  Constitutional: Negative for fever and chills.  Gastrointestinal: Negative for nausea, vomiting and abdominal pain.  Musculoskeletal: Negative for myalgias.  Neurological: Negative for weakness and numbness.     Allergies  Review of patient's allergies indicates no known allergies.  Home Medications   Current Outpatient Rx  Name  Route  Sig  Dispense  Refill  . aspirin 81 MG tablet   Oral   Take 81 mg by mouth daily.         Marland Kitchen FLUoxetine (PROZAC) 20 MG tablet   Oral   Take 20 mg by mouth daily.         . fluticasone (FLONASE) 50 MCG/ACT nasal spray   Nasal   Place 2 sprays into the nose daily.          . mesalamine (LIALDA) 1.2 G EC tablet   Oral   Take 4.8 g by mouth daily.         . mesalamine (ROWASA) 4 G enema   Rectal   Place 60 mLs (4 g total) rectally at bedtime.   30 Bottle   11   . metroNIDAZOLE (METROGEL) 1 % gel   Topical   Apply 1 application topically daily.         . predniSONE (DELTASONE) 10 MG tablet   Oral   Take 1 tablet (10 mg total) by mouth as directed.   150 tablet   2    Triage Vitals: BP 170/94  Pulse 77  Temp(Src) 98.8 F (37.1 C) (Oral)  Resp 16  Ht 6'  3" (1.905 m)  Wt 212 lb (96.163 kg)  BMI 26.5 kg/m2  SpO2 97%  Physical Exam  Nursing note and vitals reviewed. Constitutional: He appears well-developed and well-nourished. No distress.  HENT:  Head: Normocephalic and atraumatic.  Eyes: Conjunctivae are normal.  Neck: Normal range of motion. Neck supple.  Pulmonary/Chest: Effort normal.  Musculoskeletal: Normal range of motion.  Neurological: He is alert. He exhibits normal muscle tone.  Skin: He is not diaphoretic.  Psychiatric: He has a normal mood and affect. His behavior is normal. Thought content normal.    ED Course  Procedures (including critical care time)  DIAGNOSTIC STUDIES: Oxygen Saturation is 97% on room air, normal by my interpretation.    COORDINATION OF CARE:  5:40 PM - Will order rabies vaccine and immune globulin injection. Patient verbalizes understanding and agrees with treatment plan.    Labs Review Labs Reviewed - No data to display Imaging Review No results found.  MDM   1.  Exposure to bat without known bite     Patient with multiple bats in the house over the weekend that had access to patient's sleeping quarters.  No known bite.  Bats were caught but not turned in to authorities.  Investigation is pending.  I discussed the case with the patient who agrees to immunoglobulin and vaccination.  Pt to follow up for further vaccinations with urgent care.  Information given regarding rabies, exposure, and medications given.  Family to also follow up with animal control.    I personally performed the services described in this documentation, which was scribed in my presence. The recorded information has been reviewed and is accurate.   Daniel Bibles, PA-C 05/13/13 [phone]

## 2013-05-16 ENCOUNTER — Emergency Department (INDEPENDENT_AMBULATORY_CARE_PROVIDER_SITE_OTHER)
Admission: EM | Admit: 2013-05-16 | Discharge: 2013-05-16 | Disposition: A | Discharge status: Home / Self Care | Payer: BC Managed Care – PPO | Source: Home / Self Care

## 2013-05-16 ENCOUNTER — Encounter (HOSPITAL_COMMUNITY): Payer: Self-pay | Admitting: Emergency Medicine

## 2013-05-16 DIAGNOSIS — Z203 Contact with and (suspected) exposure to rabies: Secondary | ICD-10-CM

## 2013-05-16 MED ORDER — RABIES VACCINE, PCEC IM SUSR
1.0000 mL | Freq: Once | INTRAMUSCULAR | Status: AC
  Administered 2013-05-16: 1 mL via INTRAMUSCULAR

## 2013-05-16 MED ORDER — RABIES VACCINE, PCEC IM SUSR
INTRAMUSCULAR | Status: AC
  Filled 2013-05-16: qty 1

## 2013-05-16 NOTE — ED Notes (Signed)
Pt is coming for 2nd series of Rabies Vaccination (Day 3) Exposure to bat only ... No bite reported Voices no new concerns.... Alert w/no signs of acute distress.

## 2013-05-20 ENCOUNTER — Encounter (HOSPITAL_COMMUNITY): Payer: Self-pay

## 2013-05-20 ENCOUNTER — Emergency Department (INDEPENDENT_AMBULATORY_CARE_PROVIDER_SITE_OTHER)
Admission: EM | Admit: 2013-05-20 | Discharge: 2013-05-20 | Disposition: A | Discharge status: Home / Self Care | Payer: BC Managed Care – PPO | Source: Home / Self Care

## 2013-05-20 DIAGNOSIS — Z203 Contact with and (suspected) exposure to rabies: Secondary | ICD-10-CM

## 2013-05-20 MED ORDER — RABIES VACCINE, PCEC IM SUSR
1.0000 mL | Freq: Once | INTRAMUSCULAR | Status: AC
  Administered 2013-05-20: 1 mL via INTRAMUSCULAR

## 2013-05-20 MED ORDER — RABIES VACCINE, PCEC IM SUSR
INTRAMUSCULAR | Status: AC
  Filled 2013-05-20: qty 1

## 2013-05-20 NOTE — ED Notes (Signed)
Here for day #7, 3 occurrence of rabies series

## 2013-05-27 ENCOUNTER — Encounter (HOSPITAL_COMMUNITY): Payer: Self-pay

## 2013-05-27 ENCOUNTER — Emergency Department (INDEPENDENT_AMBULATORY_CARE_PROVIDER_SITE_OTHER)
Admission: EM | Admit: 2013-05-27 | Discharge: 2013-05-27 | Disposition: A | Discharge status: Home / Self Care | Payer: BC Managed Care – PPO | Source: Home / Self Care

## 2013-05-27 DIAGNOSIS — Z203 Contact with and (suspected) exposure to rabies: Secondary | ICD-10-CM

## 2013-05-27 MED ORDER — RABIES VACCINE, PCEC IM SUSR
INTRAMUSCULAR | Status: AC
  Filled 2013-05-27: qty 1

## 2013-05-27 MED ORDER — RABIES VACCINE, PCEC IM SUSR
1.0000 mL | Freq: Once | INTRAMUSCULAR | Status: AC
  Administered 2013-05-27: 1 mL via INTRAMUSCULAR

## 2013-05-27 NOTE — ED Notes (Signed)
Here for final shot in series of rabies ; NAD

## 2013-06-19 ENCOUNTER — Encounter: Payer: Self-pay | Admitting: Gastroenterology

## 2013-06-19 ENCOUNTER — Ambulatory Visit (INDEPENDENT_AMBULATORY_CARE_PROVIDER_SITE_OTHER): Payer: BC Managed Care – PPO | Admitting: Gastroenterology

## 2013-06-19 VITALS — BP 126/68 | HR 72 | Ht 75.0 in | Wt 214.0 lb

## 2013-06-19 DIAGNOSIS — K5190 Ulcerative colitis, unspecified, without complications: Secondary | ICD-10-CM

## 2013-06-19 NOTE — Progress Notes (Signed)
Review of pertinent gastrointestinal problems:  1. left sided ulcerative colitis. Dr. 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 Patient has been treated over the years with oral mesalamine and intermittent mesalamine suppositories. Flared with bleeding, urgency, frequency Spring 2013. Colonoscopy April 2013 showed moderate to severe inflammation up to 25 cm. Biopsies confirmed chronic inflammation. Terminal ileum was normal. He was started on 40 mg prednisone, mesalamine enema (continued on mesalamine orally). May, 2013: Symptoms improved very quickly on prednisone. 04/2013 Flare of symptoms beginning winder 2013/14; saw extender 03/2013; c. Diff negative, started Euceris, continued oral mesalamine; 3 weeks later symptoms a bit worse and put on prednisone 40 with taper, continue oral mesalamine, added rowasa. 9/14 improving, prednisone tapering.   HPI: This is a  very pleasant 42 year old man whom I last saw about 2 months ago. He is doing very well.  Currently off pred for past 3 weeks.  Bowels are still normal.  He did have 3 episodes of discrete red bleeding, 2 weeks apart, no diarrhrea.  No diarrhea, no urgency.  Still on nightly rowasa enema   Past Medical History  Diagnosis Date  . Ulcerative proctitis   . Hyperlipidemia   . Depression     History reviewed. No pertinent past surgical history.  Current Outpatient Prescriptions  Medication Sig Dispense Refill  . aspirin 81 MG tablet Take 81 mg by mouth daily.      Marland Kitchen FLUoxetine (PROZAC) 20 MG tablet Take 20 mg by mouth daily.      . fluticasone (FLONASE) 50 MCG/ACT nasal spray Place 2 sprays into the nose daily.       . mesalamine (LIALDA) 1.2 G EC tablet Take 4.8 g by mouth daily.      . mesalamine (ROWASA) 4 G enema Place 60 mLs (4 g total)  rectally at bedtime.  30 Bottle  11  . metroNIDAZOLE (METROGEL) 1 % gel Apply 1 application topically daily.      . predniSONE (DELTASONE) 10 MG tablet Take 1 tablet (10 mg total) by mouth as directed.  150 tablet  2   No current facility-administered medications for this visit.    Allergies as of 06/19/2013  . (No Known Allergies)    Family History  Problem Relation Age of Onset  . Colon cancer Neg Hx   . Kidney cancer Mother   . Hypertension Mother   . Aneurysm Father     abdominal aortic    History   Social History  . Marital Status: Married    Spouse Name: N/A    Number of Children: 2  . Years of Education: N/A   Occupational History  . Media planner    Social History Main Topics  . Smoking status: Never Smoker   . Smokeless tobacco: Never Used  . Alcohol Use: Yes     Comment: 2 per day   . Drug Use: No  . Sexual Activity: Not on file   Other Topics Concern  . Not on file   Social History Narrative  . No narrative on file      Physical Exam: BP 126/68  Pulse 72  Ht 6' 3"  (1.905 m)  Wt 214 lb (97.07 kg)  BMI 26.75 kg/m2 Constitutional: generally well-appearing Psychiatric: alert and oriented x3 Abdomen: soft, nontender, nondistended, no obvious ascites, no peritoneal signs, normal bowel sounds  Assessment and plan: 42 y.o. male with left-sided ulcerative colitis  Dr. quite a while today to try to figure why he had this layer. He did have a slight interruption in his mesalamine but it was only for for 5 days. Other than that I can 0.2 no other risk factors for why he started flaring this past winter. Possibly the mesalamine product is just barely controlling his inflammation. I think he has another flare within the next one to 2 years without clear explanation outlined to increase him to immunomodulators. I would possibly want to look into his colon at that point but we will have to see how he does clinically. For now he'll stay on Rowasa enemas  nightly for another 2 months and then cut back to every other month. He will stay on his mesalamine full-strength daily. He will return to see me in 3-4 months and sooner if needed.

## 2013-06-19 NOTE — Patient Instructions (Signed)
Stay on lialda 4 pills once daily. Rowasa nightly for 8-10 more weeks, then every other night. Please return to see Dr. Ardis Hughs in 3-4 months, sooner if needed.

## 2013-07-16 ENCOUNTER — Other Ambulatory Visit: Payer: Self-pay

## 2013-10-09 ENCOUNTER — Telehealth: Payer: Self-pay | Admitting: Gastroenterology

## 2013-10-12 NOTE — Telephone Encounter (Signed)
Pt says his current medication regimen is not working for his UC flare, he is taking lialda and using  Rowasa enemas with minimal relief.  He was offered an appt today with extender but declined so appt was scheduled for 10/14/12 with Janett Billow he will call if symptoms worsen or go to the ER

## 2013-10-14 ENCOUNTER — Other Ambulatory Visit (INDEPENDENT_AMBULATORY_CARE_PROVIDER_SITE_OTHER): Payer: BC Managed Care – PPO

## 2013-10-14 ENCOUNTER — Encounter: Payer: Self-pay | Admitting: Gastroenterology

## 2013-10-14 ENCOUNTER — Ambulatory Visit (INDEPENDENT_AMBULATORY_CARE_PROVIDER_SITE_OTHER): Payer: BC Managed Care – PPO | Admitting: Gastroenterology

## 2013-10-14 VITALS — BP 128/82 | HR 76 | Ht 75.0 in | Wt 211.4 lb

## 2013-10-14 DIAGNOSIS — K5130 Ulcerative (chronic) rectosigmoiditis without complications: Secondary | ICD-10-CM

## 2013-10-14 DIAGNOSIS — K625 Hemorrhage of anus and rectum: Secondary | ICD-10-CM

## 2013-10-14 DIAGNOSIS — K5120 Ulcerative (chronic) proctitis without complications: Secondary | ICD-10-CM

## 2013-10-14 DIAGNOSIS — R197 Diarrhea, unspecified: Secondary | ICD-10-CM

## 2013-10-14 LAB — CBC WITH DIFFERENTIAL/PLATELET
BASOS ABS: 0.1 10*3/uL (ref 0.0–0.1)
Basophils Relative: 1 % (ref 0.0–3.0)
EOS ABS: 0.2 10*3/uL (ref 0.0–0.7)
Eosinophils Relative: 3.3 % (ref 0.0–5.0)
HCT: 50.8 % (ref 39.0–52.0)
Hemoglobin: 16.9 g/dL (ref 13.0–17.0)
LYMPHS ABS: 1.7 10*3/uL (ref 0.7–4.0)
LYMPHS PCT: 29.2 % (ref 12.0–46.0)
MCHC: 33.3 g/dL (ref 30.0–36.0)
MCV: 86.3 fl (ref 78.0–100.0)
MONOS PCT: 7.3 % (ref 3.0–12.0)
Monocytes Absolute: 0.4 10*3/uL (ref 0.1–1.0)
NEUTROS ABS: 3.4 10*3/uL (ref 1.4–7.7)
Neutrophils Relative %: 59.2 % (ref 43.0–77.0)
PLATELETS: 245 10*3/uL (ref 150.0–400.0)
RBC: 5.89 Mil/uL — ABNORMAL HIGH (ref 4.22–5.81)
RDW: 13.3 % (ref 11.5–14.6)
WBC: 5.8 10*3/uL (ref 4.5–10.5)

## 2013-10-14 LAB — COMPREHENSIVE METABOLIC PANEL
ALT: 23 U/L (ref 0–53)
AST: 27 U/L (ref 0–37)
Albumin: 4.5 g/dL (ref 3.5–5.2)
Alkaline Phosphatase: 66 U/L (ref 39–117)
BILIRUBIN TOTAL: 1.3 mg/dL — AB (ref 0.3–1.2)
BUN: 12 mg/dL (ref 6–23)
CHLORIDE: 102 meq/L (ref 96–112)
CO2: 27 mEq/L (ref 19–32)
CREATININE: 1 mg/dL (ref 0.4–1.5)
Calcium: 9.6 mg/dL (ref 8.4–10.5)
GFR: 83.72 mL/min (ref >60.00)
Glucose, Bld: 81 mg/dL (ref 70–99)
Potassium: 5.1 mEq/L (ref 3.5–5.1)
Sodium: 138 mEq/L (ref 135–145)
TOTAL PROTEIN: 7.6 g/dL (ref 6.0–8.3)

## 2013-10-14 LAB — SEDIMENTATION RATE: Sed Rate: 7 mm/hr (ref 0–22)

## 2013-10-14 LAB — HIGH SENSITIVITY CRP: CRP, High Sensitivity: 3.48 mg/L (ref 0.000–5.000)

## 2013-10-14 MED ORDER — PREDNISONE 20 MG PO TABS: 20.0000 mg | ORAL_TABLET | Freq: Two times a day (BID) | ORAL | Status: DC

## 2013-10-14 NOTE — Patient Instructions (Signed)
Please go to the basement level to have your labs drawn and stool studies. Take 2 tab with Lialda in the Am and 2 tab Lialda in the PM.    We made you a follow up appointment with Dr. Ardis Hughs on 11-06-2013 at 9:45 am.

## 2013-10-14 NOTE — Progress Notes (Signed)
i agree with the plan above 

## 2013-10-14 NOTE — Progress Notes (Signed)
10/14/2013 Daniel Bridges [id number] [birth year]   History of Present Illness:  This is a pleasant 43 year old male who is known to Dr. Ardis Hughs for treatment of his ulcerative proctocolitis. He was last seen in October 2014 at which time he was doing well after a prednisone treatment and taper in September. His last colonoscopy was in April 2013 which showed moderate to severe inflammation up to 25 cm and biopsies confirmed chronic inflammation.  He comes in today with complaints of typical flare. Says that he is having several bowel movements a day and there is always blood in his stool. This started in December before Christmas and at the time that  the flare began he was taking his Lialda 4.8 grams daily as well as Rowasa enemas at bedtime. He has gotten to the point where he stopped the enemas and has just been using them sporadically because they do not stay for long anyway since he is having frequent bowel movements. He has gotten some relief of his frequency with Imodium. He has responded well to prednisone in the past, but Uceris did not work for him.  At his visit with Dr. Ardis Hughs in October there was mention that if he had another flare within the next 1 to 2 years then they would possibly need to escalate therapy to immuno-modulators.   Current Medications, Allergies, Past Medical History, Past Surgical History, Family History and Social History were reviewed in [employer] [employer] record.   Physical Exam: BP 128/82  Pulse 76  Ht 6' 3"  (1.905 m)  Wt 211 lb 6.4 oz (95.89 kg)  BMI 26.42 kg/m2 General: Well developed white male in no acute distress Head: Normocephalic and atraumatic Eyes:  Sclerae anicteric, conjunctiva pink  Ears: Normal auditory acuity Lungs: Clear throughout to auscultation Heart: Regular rate and rhythm Abdomen: Soft, non-distended.  Normal bowel sounds.  Minimal discomfort in LLQ upon palpation of the abdomen. Musculoskeletal: Symmetrical with no  gross deformities  Extremities: No edema  Neurological: Alert oriented x 4, grossly non-focal Psychological:  Alert and cooperative. Normal mood and affect  Assessment and Recommendations: -Ulcerative proctocolitis:  Currently with a flare. We will check stool culture and stool for C. difficile. We'll also check labs including CBC, CMP, CRP, and sedimentation rate. Will also check a TPMT level in anticipation for need of escalation of therapy. He will continue his Lialda 4.8 grams daily, but will begin taking in divided doses with 2.4 g in the morning and 2.4 g in the evening. He has responded well to prednisone in the past so we'll start him on prednisone 20 mg twice a day. He'll continue on that dose of prednisone until he sees Dr. Ardis Hughs in followup in 3 weeks.

## 2013-10-23 ENCOUNTER — Emergency Department (HOSPITAL_COMMUNITY)
Admission: EM | Admit: 2013-10-23 | Discharge: 2013-10-23 | Disposition: A | Discharge status: Home / Self Care | Payer: BC Managed Care – PPO | Attending: Emergency Medicine | Admitting: Emergency Medicine

## 2013-10-23 ENCOUNTER — Emergency Department (HOSPITAL_COMMUNITY): Payer: BC Managed Care – PPO

## 2013-10-23 ENCOUNTER — Ambulatory Visit: Payer: BC Managed Care – PPO | Admitting: Gastroenterology

## 2013-10-23 ENCOUNTER — Encounter (HOSPITAL_COMMUNITY): Payer: Self-pay | Admitting: Emergency Medicine

## 2013-10-23 DIAGNOSIS — R059 Cough, unspecified: Secondary | ICD-10-CM | POA: Insufficient documentation

## 2013-10-23 DIAGNOSIS — IMO0002 Reserved for concepts with insufficient information to code with codable children: Secondary | ICD-10-CM | POA: Insufficient documentation

## 2013-10-23 DIAGNOSIS — Z79899 Other long term (current) drug therapy: Secondary | ICD-10-CM | POA: Insufficient documentation

## 2013-10-23 DIAGNOSIS — Z862 Personal history of diseases of the blood and blood-forming organs and certain disorders involving the immune mechanism: Secondary | ICD-10-CM | POA: Insufficient documentation

## 2013-10-23 DIAGNOSIS — Y93B2 Activity, push-ups, pull-ups, sit-ups: Secondary | ICD-10-CM | POA: Insufficient documentation

## 2013-10-23 DIAGNOSIS — R209 Unspecified disturbances of skin sensation: Secondary | ICD-10-CM | POA: Insufficient documentation

## 2013-10-23 DIAGNOSIS — F329 Major depressive disorder, single episode, unspecified: Secondary | ICD-10-CM | POA: Insufficient documentation

## 2013-10-23 DIAGNOSIS — R05 Cough: Secondary | ICD-10-CM | POA: Insufficient documentation

## 2013-10-23 DIAGNOSIS — R42 Dizziness and giddiness: Secondary | ICD-10-CM | POA: Insufficient documentation

## 2013-10-23 DIAGNOSIS — Z8719 Personal history of other diseases of the digestive system: Secondary | ICD-10-CM | POA: Insufficient documentation

## 2013-10-23 DIAGNOSIS — Y929 Unspecified place or not applicable: Secondary | ICD-10-CM | POA: Insufficient documentation

## 2013-10-23 DIAGNOSIS — F3289 Other specified depressive episodes: Secondary | ICD-10-CM | POA: Insufficient documentation

## 2013-10-23 DIAGNOSIS — R0602 Shortness of breath: Secondary | ICD-10-CM | POA: Insufficient documentation

## 2013-10-23 DIAGNOSIS — R110 Nausea: Secondary | ICD-10-CM

## 2013-10-23 DIAGNOSIS — R61 Generalized hyperhidrosis: Secondary | ICD-10-CM | POA: Insufficient documentation

## 2013-10-23 DIAGNOSIS — X500XXA Overexertion from strenuous movement or load, initial encounter: Secondary | ICD-10-CM | POA: Insufficient documentation

## 2013-10-23 DIAGNOSIS — J3489 Other specified disorders of nose and nasal sinuses: Secondary | ICD-10-CM | POA: Insufficient documentation

## 2013-10-23 DIAGNOSIS — Z8639 Personal history of other endocrine, nutritional and metabolic disease: Secondary | ICD-10-CM | POA: Insufficient documentation

## 2013-10-23 LAB — CBC WITH DIFFERENTIAL/PLATELET
BASOS ABS: 0 10*3/uL (ref 0.0–0.1)
Basophils Relative: 0 % (ref 0–1)
Eosinophils Absolute: 0 10*3/uL (ref 0.0–0.7)
Eosinophils Relative: 0 % (ref 0–5)
HEMATOCRIT: 42.6 % (ref 39.0–52.0)
Hemoglobin: 15.6 g/dL (ref 13.0–17.0)
LYMPHS ABS: 1.7 10*3/uL (ref 0.7–4.0)
LYMPHS PCT: 18 % (ref 12–46)
MCH: 29.5 pg (ref 26.0–34.0)
MCHC: 36.6 g/dL — ABNORMAL HIGH (ref 30.0–36.0)
MCV: 80.7 fL (ref 78.0–100.0)
MONO ABS: 0.4 10*3/uL (ref 0.1–1.0)
Monocytes Relative: 4 % (ref 3–12)
NEUTROS ABS: 7.3 10*3/uL (ref 1.7–7.7)
Neutrophils Relative %: 77 % (ref 43–77)
Platelets: 271 10*3/uL (ref 150–400)
RBC: 5.28 MIL/uL (ref 4.22–5.81)
RDW: 13 % (ref 11.5–15.5)
WBC: 9.4 10*3/uL (ref 4.0–10.5)

## 2013-10-23 LAB — URINALYSIS, ROUTINE W REFLEX MICROSCOPIC
BILIRUBIN URINE: NEGATIVE
Glucose, UA: NEGATIVE mg/dL
Hgb urine dipstick: NEGATIVE
Ketones, ur: NEGATIVE mg/dL
LEUKOCYTES UA: NEGATIVE
Nitrite: NEGATIVE
PH: 6.5 (ref 5.0–8.0)
Protein, ur: NEGATIVE mg/dL
SPECIFIC GRAVITY, URINE: 1.018 (ref 1.005–1.030)
Urobilinogen, UA: 0.2 mg/dL (ref 0.0–1.0)

## 2013-10-23 LAB — COMPREHENSIVE METABOLIC PANEL
ALT: 28 U/L (ref 0–53)
AST: 25 U/L (ref 0–37)
Albumin: 4.1 g/dL (ref 3.5–5.2)
Alkaline Phosphatase: 68 U/L (ref 39–117)
BUN: 14 mg/dL (ref 6–23)
CALCIUM: 9.4 mg/dL (ref 8.4–10.5)
CO2: 22 mEq/L (ref 19–32)
CREATININE: 1.03 mg/dL (ref 0.50–1.35)
Chloride: 98 mEq/L (ref 96–112)
GFR calc non Af Amer: 87 mL/min — ABNORMAL LOW (ref >90)
GLUCOSE: 124 mg/dL — AB (ref 70–99)
Potassium: 4.5 mEq/L (ref 3.7–5.3)
Sodium: 136 mEq/L — ABNORMAL LOW (ref 137–147)
Total Bilirubin: 0.4 mg/dL (ref 0.3–1.2)
Total Protein: 7.5 g/dL (ref 6.0–8.3)

## 2013-10-23 LAB — POCT I-STAT TROPONIN I
TROPONIN I, POC: 0 ng/mL (ref 0.00–0.08)
Troponin i, poc: 0 ng/mL (ref 0.00–0.08)

## 2013-10-23 MED ORDER — SODIUM CHLORIDE 0.9 % IV BOLUS (SEPSIS)
1000.0000 mL | Freq: Once | INTRAVENOUS | Status: AC
  Administered 2013-10-23: 1000 mL via INTRAVENOUS

## 2013-10-23 NOTE — ED Notes (Addendum)
Presents with sudden onset of left sided flank pain assocaited with diaphoresis, nausea, SOB and pale skin color, near syncopal episode. Pain began while driving, nothing made pain worse, nothing made pain better. Pain went away on own in 15 minutes. Did not radiate. [facility] EKG showed RBBB and elevation in V2 which resolved when pain ceased. Pt denies pain at this time.  Reports dizziness. Skin warm dry intact. Reports mild SOB, bilateral breath sounds clear. +2 radial pulses.  Recently started on prednisone 2 weeks ago.  GIven 324 of ASA inroute

## 2013-10-23 NOTE — ED Provider Notes (Signed)
CSN: [id number]     Arrival date & time 10/23/13  1744 History   First MD Initiated Contact with Patient 10/23/13 1754     Chief Complaint  Patient presents with  . Flank Pain     (Consider location/radiation/quality/duration/timing/severity/associated sxs/prior Treatment) Patient is a 43 y.o. male presenting with flank pain.  Flank Pain Associated symptoms include congestion, coughing and nausea. Pertinent negatives include no abdominal pain, arthralgias, chest pain, chills, fever, neck pain, sore throat or vomiting.   43 yo male presents with LEFT lower back pain that started while he was working out doing "lat pull downs". Pain described as burning, squeezing pain that was constant for about 20 mniutes and then resolved. Patient states pain rated at 7/10. Patient was driving home when he became diaphoretic, lightheaded, and patient describes bilateral arm paresthesias. No Chest pain. Admits to a little SOB with episode that has since improved. Patient states he had "a bit of a cold recently" since Tuesday. PMH significant for ulcerative proctitis and hyperlipidemia for which patient is currently controlling with lifestyle modifications. Patient admits to recent flare of proctitis for which he is currently on prednisone for.  Patient denies any additional cardiac hx.  Advanced age > 59 yo: No HTN: No Hyperlipidemia: Yes Cigarette smoking: No Diabetes Mellitus: No Family hx of CAD or MI < 72 yo : No Male or Post menopausal: Yes Cocaine use: No Prior MI: No CABG: No Stress test: No Angina: No   Past Medical History  Diagnosis Date  . Ulcerative proctitis   . Hyperlipidemia   . Depression    History reviewed. No pertinent past surgical history. Family History  Problem Relation Age of Onset  . Colon cancer Neg Hx   . Kidney cancer Mother   . Hypertension Mother   . Aneurysm Father     abdominal aortic   History  Substance Use Topics  . Smoking status: Never Smoker   .  Smokeless tobacco: Never Used  . Alcohol Use: Yes     Comment: 2 per day     Review of Systems  Constitutional: Negative for fever and chills.  HENT: Positive for congestion, rhinorrhea and sneezing. Negative for ear discharge, ear pain, sinus pressure and sore throat.   Eyes: Negative for visual disturbance.  Respiratory: Positive for cough and shortness of breath. Negative for chest tightness.   Cardiovascular: Negative for chest pain.  Gastrointestinal: Positive for nausea. Negative for vomiting, abdominal pain and diarrhea.  Genitourinary: Positive for flank pain.  Musculoskeletal: Negative for arthralgias, neck pain and neck stiffness.  All other systems reviewed and are negative.      Allergies  Review of patient's allergies indicates no known allergies.  Home Medications   Current Outpatient Rx  Name  Route  Sig  Dispense  Refill  . FLUoxetine (PROZAC) 20 MG tablet   Oral   Take 20 mg by mouth daily.         . fluticasone (FLONASE) 50 MCG/ACT nasal spray   Nasal   Place 2 sprays into the nose daily as needed for allergies.          . Homeopathic Products (ZICAM COLD REMEDY PO)   Oral   Take 1 tablet by mouth daily as needed (for cold).         . mesalamine (LIALDA) 1.2 G EC tablet   Oral   Take 4.8 g by mouth daily.         . Multiple Vitamin (MULTIVITAMIN WITH  MINERALS) TABS tablet   Oral   Take 1 tablet by mouth daily.         . predniSONE (DELTASONE) 20 MG tablet   Oral   Take 1 tablet (20 mg total) by mouth 2 (two) times daily with a meal.   60 tablet   0    BP 128/73  Pulse 64  Temp(Src) 97.9 F (36.6 C) (Oral)  Resp 18  SpO2 100% Physical Exam  Nursing note and vitals reviewed. Constitutional: He is oriented to person, place, and time. He appears well-developed and well-nourished. No distress.  HENT:  Head: Normocephalic and atraumatic.  Mouth/Throat: Uvula is midline, oropharynx is clear and moist and mucous membranes are  normal.  Eyes: Conjunctivae and EOM are normal. Pupils are equal, round, and reactive to light.  Neck: Trachea normal and phonation normal. Neck supple. No JVD present. Carotid bruit is not present. No tracheal deviation present.  Cardiovascular: Normal rate, regular rhythm and intact distal pulses.  Exam reveals no gallop and no friction rub.   No murmur heard. Pulmonary/Chest: Effort normal and breath sounds normal. Not tachypneic. No respiratory distress. He has no decreased breath sounds. He has no wheezes. He has no rhonchi. He has no rales.  Abdominal: Soft. Normal appearance and bowel sounds are normal. He exhibits no distension. There is no tenderness. There is no rigidity, no guarding, no tenderness at McBurney's point and negative Murphy's sign.  Musculoskeletal: Normal range of motion. He exhibits no edema.  No midline spinal tenderness.  No CVA tenderness.  No paraspinal tenderness.  No obvious spasm.   Neurological: He is alert and oriented to person, place, and time. He has normal strength. No cranial nerve deficit or sensory deficit. Coordination and gait normal.  Reflex Scores:      Bicep reflexes are 2+ on the right side and 2+ on the left side.      Patellar reflexes are 2+ on the right side and 2+ on the left side. CN II-XII grossly intact. Cerebellar function appears intact with finger to nose exam. Patient ambulates in room without assistance.    Skin: Skin is warm and dry. He is not diaphoretic.  Psychiatric: He has a normal mood and affect. His behavior is normal.    ED Course  Procedures (including critical care time) Labs Review Labs Reviewed  COMPREHENSIVE METABOLIC PANEL - Abnormal; Notable for the following:    Sodium 136 (*)    Glucose, Bld 124 (*)    GFR calc non Af Amer 87 (*)    All other components within normal limits  CBC WITH DIFFERENTIAL - Abnormal; Notable for the following:    MCHC 36.6 (*)    All other components within normal limits   URINALYSIS, ROUTINE W REFLEX MICROSCOPIC  POCT I-STAT TROPONIN I  POCT I-STAT TROPONIN I   Imaging Review Dg Chest 2 View  10/23/2013   CLINICAL DATA:  Left upper back pain.  EXAM: CHEST  2 VIEW  COMPARISON:  No priors.  FINDINGS: Lung volumes are normal. No consolidative airspace disease. No pleural effusions. No pneumothorax. No pulmonary nodule or mass noted. Pulmonary vasculature and the cardiomediastinal silhouette are within normal limits.  IMPRESSION: 1.  No radiographic evidence of acute cardiopulmonary disease.   Electronically Signed   By: Vinnie Langton M.D.   On: 10/23/2013 20:51    EKG Interpretation    Date/Time:  Friday October 23 2013 18:41:24 EST Ventricular Rate:  57 PR Interval:  142 QRS Duration:  113 QT Interval:  423 QTC Calculation: 412 R Axis:   50 Text Interpretation:  Sinus rhythm Borderline intraventricular conduction delay RSR' in V1 or V2, probably normal variant ST elev, probable normal early repol pattern Confirmed by WARD  DO, KRISTEN ([id number]) on 10/23/2013 6:59:06 PM            MDM   Final diagnoses:  Dizziness  Nausea  Shortness of breath   Afebrile with normal VS.  CXR negative. No widened mediastinum or acute abnormalities EKG shows no signs of ischemia Delta troponin negative. Patient minimal risk for MI. CMP - WNL CBC - WNL UA WNL  Patient in NAD. States he is feeling well and ready for discharge. Patient appears stable for discharge. Discussed labs, and exam findings with patient. Advised follow up with PCP in 2 days.  Recommend return to ED should patient symptoms return, develop chest pain, or shortness of breath. Patient agrees with plan. Discharged in good condition.     Meds given in ED:  Medications  sodium chloride 0.9 % bolus 1,000 mL (0 mLs Intravenous Stopped 10/23/13 2205)    Discharge Medication List as of 10/23/2013 11:47 PM       Sherrie George, PA-C 10/24/13 1144

## 2013-10-23 NOTE — Discharge Instructions (Signed)
Follow up with your primary care doctor in 2 days for follow up. Return to ED should you develop any worsening symptoms, progressive shortness of breath, feeling like your about to pass out, or develop chest pain.   Dizziness  Dizziness means you feel unsteady or lightheaded. You might feel like you are going to pass out (faint). HOME CARE   Drink enough fluids to keep your pee (urine) clear or pale yellow.  Take your medicines exactly as told by your doctor. If you take blood pressure medicine, always stand up slowly from the lying or sitting position. Hold on to something to steady yourself.  If you need to stand in one place for a long time, move your legs often. Tighten and relax your leg muscles.  Have someone stay with you until you feel okay.  Do not drive or use heavy machinery if you feel dizzy.  Do not drink alcohol. GET HELP RIGHT AWAY IF:   You feel dizzy or lightheaded and it gets worse.  You feel sick to your stomach (nauseous), or you throw up (vomit).  You have trouble talking or walking.  You feel weak or have trouble using your arms, hands, or legs.  You cannot think clearly or have trouble forming sentences.  You have chest pain, belly (abdominal) pain, sweating, or you are short of breath.  Your vision changes.  You are bleeding.  You have problems from your medicine that seem to be getting worse. MAKE SURE YOU:   Understand these instructions.  Will watch your condition.  Will get help right away if you are not doing well or get worse. Document Released: 08/16/2011 Document Revised: 11/19/2011 Document Reviewed: 08/16/2011 [facility] Patient Information 2014 [place], [place].

## 2013-10-23 NOTE — ED Provider Notes (Signed)
Medical screening examination/treatment/procedure(s) were conducted as a shared visit with non-physician practitioner(s) and myself.  I personally evaluated the patient during the encounter.  EKG Interpretation    Date/Time:  Friday October 23 2013 18:41:24 EST Ventricular Rate:  57 PR Interval:  142 QRS Duration: 113 QT Interval:  423 QTC Calculation: 412 R Axis:   50 Text Interpretation:  Sinus rhythm Borderline intraventricular conduction delay RSR' in V1 or V2, probably normal variant ST elev, probable normal early repol pattern Confirmed by Anairis Knick  DO, Graziella Connery ([id number]) on 10/23/2013 6:59:06 PM            Patient is a 43 year old male with history of hyperlipidemia who presents emergency department with left lower back pain that started while doing a workout today. He desires the pain is sharp and cramping in the last approximately 20-30 minutes. While driving him he began to feel short of breath, nauseous and dizzy and felt like he was going to pass out had to pull over his car. He denies that he's had any chest pain. He reports that he is now back to baseline. Denies any recent vomiting but has had diarrhea as he has a history of ulcerative proctitis. He is currently on prednisone for this. No bloody stools or melena. No numbness currently or focal weakness. No headache. No history of hypertension, diabetes, tobacco abuse, CAD or CHF, atrial fibrillation, CVA. No family history of any cardiac disease. This episode occurred approximately 5 PM today. Patient reports he is normally able to exercise without any symptoms and he is a very active man. I feel this is unlikely arrhythmia or ACS given his minimal risk factors. He is neurologically intact. Hemodynamically stable. Will give IV fluids for possible dehydration. His initial troponin is negative. His EKG shows J-point elevation but no ischemic changes. Will repeat troponin at 11 PM, 6 hours after the onset of symptoms. If negative and patient  is still feeling well, anticipate he can be discharged home. He has a PCP for followup. He is comfortable with this plan and would prefer discharge over admission.   Second troponin negative. Pt is still asymptomatic.  Will dc home.  [place], DO 10/23/13 2345

## 2013-10-26 LAB — THIOPURINE METHYLTRANSFERASE (TPMT), RBC: Thiopurine Methyltransferase, RBC: 10 — ABNORMAL LOW (ref >12)

## 2013-11-06 ENCOUNTER — Encounter: Payer: Self-pay | Admitting: Gastroenterology

## 2013-11-06 ENCOUNTER — Ambulatory Visit (INDEPENDENT_AMBULATORY_CARE_PROVIDER_SITE_OTHER): Payer: BC Managed Care – PPO | Admitting: Gastroenterology

## 2013-11-06 VITALS — BP 152/90 | HR 80 | Ht 75.0 in | Wt 219.2 lb

## 2013-11-06 DIAGNOSIS — K529 Noninfective gastroenteritis and colitis, unspecified: Secondary | ICD-10-CM

## 2013-11-06 DIAGNOSIS — K5289 Other specified noninfective gastroenteritis and colitis: Secondary | ICD-10-CM

## 2013-11-06 MED ORDER — MOVIPREP 100 G PO SOLR: 1.0000 | Freq: Once | ORAL | Status: DC

## 2013-11-06 MED ORDER — AZATHIOPRINE 50 MG PO TABS: 100.0000 mg | ORAL_TABLET | Freq: Every day | ORAL | Status: DC

## 2013-11-06 NOTE — Patient Instructions (Signed)
You will be set up for a colonoscopy ([place]) for colitis, ? Progression of disease. Start azathiaprine 130m once daily.  New script called in. CBC and cmet in 1 week. Stay on prednisone 466mper day and lialda 2 pills twice daily. Please turn the stool samples.

## 2013-11-06 NOTE — Progress Notes (Signed)
Review of pertinent gastrointestinal problems:  1. left sided ulcerative colitis. Dr. 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 Patient has been treated over the years with oral mesalamine and intermittent mesalamine suppositories. Flared with bleeding, urgency, frequency Spring 2013. Colonoscopy April 2013 showed moderate to severe inflammation up to 25 cm. Biopsies confirmed chronic inflammation. Terminal ileum was normal. He was started on 40 mg prednisone, mesalamine enema (continued on mesalamine orally). May, 2013: Symptoms improved very quickly on prednisone. 04/2013 Flare of symptoms beginning winder 2013/14; saw extender 03/2013; c. Diff negative, started Euceris, continued oral mesalamine; 3 weeks later symptoms a bit worse and put on prednisone 40 with taper, continue oral mesalamine, added rowasa. 9/14 improving, prednisone tapering. 09/2013 flaring again   HPI: This is a   very pleasant 43 year old man whom I last saw several months ago. He was seen at our office by Janett Billow about one month ago.  Sedimentation rate, blood counts were normal. He did not drop off stool samples yet.  He has been on prednisone 40 mg per day since that visit which was about a month ago.  Up to 60m twice daily prednisone.  Still intermittent diarrhea, urgency and bleeding.  Bloated abdominel.   Past Medical History  Diagnosis Date  . Ulcerative proctitis   . Hyperlipidemia   . Depression     History reviewed. No pertinent past surgical history.  Current Outpatient Prescriptions  Medication Sig Dispense Refill  . FLUoxetine (PROZAC) 20 MG tablet Take 20 mg by mouth daily.      . fluticasone (FLONASE) 50 MCG/ACT nasal spray Place 2 sprays into the nose daily as needed for allergies.       . Homeopathic Products  (ZICAM COLD REMEDY PO) Take 1 tablet by mouth daily as needed (for cold).      . mesalamine (LIALDA) 1.2 G EC tablet Take 4.8 g by mouth daily.      . Multiple Vitamin (MULTIVITAMIN WITH MINERALS) TABS tablet Take 1 tablet by mouth daily.      . predniSONE (DELTASONE) 20 MG tablet Take 1 tablet (20 mg total) by mouth 2 (two) times daily with a meal.  60 tablet  0   No current facility-administered medications for this visit.    Allergies as of 11/06/2013  . (No Known Allergies)    Family History  Problem Relation Age of Onset  . Colon cancer Neg Hx   . Kidney cancer Mother   . Hypertension Mother   . Aneurysm Father     abdominal aortic    History   Social History  . Marital Status: Married    Spouse Name: N/A    Number of Children: 2  . Years of Education: N/A   Occupational History  . tMedia planner   Social History Main Topics  . Smoking status: Never Smoker   . Smokeless tobacco: Never Used  . Alcohol Use: Yes     Comment: 2 per day   . Drug Use: No  . Sexual Activity: Not on file   Other Topics Concern  . Not on file   Social History Narrative  . No narrative on file      Physical Exam: BP 152/90  Pulse 80  Ht 6' 3"  (1.905 m)  Wt 219 lb 3.2 oz (99.428 kg)  BMI 27.40 kg/m2 Constitutional: generally well-appearing Psychiatric:  alert and oriented x3 Abdomen: soft, nontender, nondistended, no obvious ascites, no peritoneal signs, normal bowel sounds     Assessment and plan: 43 y.o. male with ulcerative colitis  He is not responding to prednisone as he has done in the past. He has been taking 40 mg of prednisone daily for about a month and is still bothered about every other day by urgency, bloody diarrhea, mild abdominal discomforts. I have to wonder if his disease has progressed, also perhaps he has overriding infection. He did not turn stool samples for C. difficile I encouraged him to do that. It would be unusual for him to have CMV colitis  since he has not been on immunomodulators and only recently started steroids again. That is however in the differential. I would like to repeat colonoscopy to stage his disease again, check for CMV. He had TPMT enzyme activity levels checked and he was a low metabolizer however on the high end of low. I think it is still safe to start azathioprine but perhaps the file dosing will be lower than the usual 2 mg to have milligrams per kilogram target dose since he is a lower than usual metabolizer. I'm going to have him start a fiber 100 mg once daily. He'll stay on prednisone 40 mg daily and the all the full-strength for now. He'll get repeat labs done in one week, CBC and cmet

## 2013-11-12 ENCOUNTER — Other Ambulatory Visit: Payer: BC Managed Care – PPO

## 2013-11-12 DIAGNOSIS — K5120 Ulcerative (chronic) proctitis without complications: Secondary | ICD-10-CM

## 2013-11-13 ENCOUNTER — Encounter: Payer: Self-pay | Admitting: Gastroenterology

## 2013-11-13 ENCOUNTER — Ambulatory Visit (AMBULATORY_SURGERY_CENTER): Payer: BC Managed Care – PPO | Admitting: Gastroenterology

## 2013-11-13 VITALS — BP 172/91 | HR 55 | Temp 96.2°F | Resp 21 | Ht 75.0 in | Wt 219.0 lb

## 2013-11-13 DIAGNOSIS — D126 Benign neoplasm of colon, unspecified: Secondary | ICD-10-CM

## 2013-11-13 DIAGNOSIS — K5289 Other specified noninfective gastroenteritis and colitis: Secondary | ICD-10-CM

## 2013-11-13 DIAGNOSIS — K5190 Ulcerative colitis, unspecified, without complications: Secondary | ICD-10-CM

## 2013-11-13 LAB — CLOSTRIDIUM DIFFICILE BY PCR: Toxigenic C. Difficile by PCR: NOT DETECTED

## 2013-11-13 MED ORDER — SODIUM CHLORIDE 0.9 % IV SOLN: 500.0000 mL | INTRAVENOUS | Status: DC

## 2013-11-13 NOTE — Progress Notes (Signed)
Called to room to assist during endoscopic procedure.  Patient ID and intended procedure confirmed with present staff. Received instructions for my participation in the procedure from the performing physician.  

## 2013-11-13 NOTE — Progress Notes (Signed)
Procedure ends, to recovery, report given and VSS. 

## 2013-11-13 NOTE — Op Note (Signed)
[facility]  [address] [place], [id number]   COLONOSCOPY PROCEDURE REPORT  PATIENT: Daniel Bridges, Daniel Bridges  MR#: [id number] BIRTHDATE: 10-Jul-1971 , 42  yrs. old GENDER: Male ENDOSCOPIST: Milus Banister, MD PROCEDURE DATE:  11/13/2013 PROCEDURE:   Colonoscopy with biopsy First Screening Colonoscopy - Avg.  risk and is 50 yrs.  old or older - No.  Prior Negative Screening - Now for repeat screening. N/A  History of Adenoma - Now for follow-up colonoscopy & has been > or = to 3 yrs.  N/A  Polyps Removed Today? Yes. ASA CLASS:   Class II INDICATIONS:distal UC, failing to remit after usual steroids. MEDICATIONS: propofol (Diprivan) 47m IV and MAC sedation, administered by CRNA  DESCRIPTION OF PROCEDURE:   After the risks benefits and alternatives of the procedure were thoroughly explained, informed consent was obtained.  A digital rectal exam revealed no abnormalities of the rectum.   The LB CF-H180AL Loaner [id number] was introduced through the anus and advanced to the terminal ileum which was intubated for a short distance. No adverse events experienced.   The quality of the prep was excellent.  The instrument was then slowly withdrawn as the colon was fully examined.  COLON FINDINGS: The terminal ileum was normal.  One small polyp was found, removed and sent to pathology.  This was 23macross, located in cecum, removed with forceps, sessile.  The right colon and majority of left colon appeared normal.  Right colon was randomly biopsied.  There was moderate to severe inflammation from anus to about 20-25cm from anal verge.  The transition to normal colon was fairly distinct but there were 2-3 small patches of "skip" like inflammation at region of transition.  Biopsies were taken from this overt inflammation.  The examination was otherwise normal. Retroflexed views revealed no abnormalities. The time to cecum=3 minutes 58 seconds.  Withdrawal time=9  minutes 46 seconds.  The scope was withdrawn and the procedure completed. COMPLICATIONS: There were no complications.  ENDOSCOPIC IMPRESSION: See above.  Colitis is NOT significantly worse or more widespread than 2 years ago.  RECOMMENDATIONS: Await final pathology.  Continue azathiaprine at 10051mer day.  You need lab tests early next week ([place] basement).  You can stop taking lialda beginning today.  Continue prednisone at current dose.  My office will set up return office appt in 4-5 weeks. Please start taking one to 2 imodium daily as needed for loose stools.   eSigned:  DanMilus BanisterD 11/13/2013 3:03 PM

## 2013-11-13 NOTE — Patient Instructions (Signed)
Discharge instructions given with verbal understanding. Handout on polyps. Resume previous medications. Biopsies taken. YOU HAD AN ENDOSCOPIC PROCEDURE TODAY AT [facility]: Refer to the procedure report that was given to you for any specific questions about what was found during the examination.  If the procedure report does not answer your questions, please call your gastroenterologist to clarify.  If you requested that your care partner not be given the details of your procedure findings, then the procedure report has been included in a sealed envelope for you to review at your convenience later.  YOU SHOULD EXPECT: Some feelings of bloating in the abdomen. Passage of more gas than usual.  Walking can help get rid of the air that was put into your GI tract during the procedure and reduce the bloating. If you had a lower endoscopy (such as a colonoscopy or flexible sigmoidoscopy) you may notice spotting of blood in your stool or on the toilet paper. If you underwent a bowel prep for your procedure, then you may not have a normal bowel movement for a few days.  DIET: Your first meal following the procedure should be a light meal and then it is ok to progress to your normal diet.  A half-sandwich or bowl of soup is an example of a good first meal.  Heavy or fried foods are harder to digest and may make you feel nauseous or bloated.  Likewise meals heavy in dairy and vegetables can cause extra gas to form and this can also increase the bloating.  Drink plenty of fluids but you should avoid alcoholic beverages for 24 hours.  ACTIVITY: Your care partner should take you home directly after the procedure.  You should plan to take it easy, moving slowly for the rest of the day.  You can resume normal activity the day after the procedure however you should NOT DRIVE or use heavy machinery for 24 hours (because of the sedation medicines used during the test).    SYMPTOMS TO REPORT  IMMEDIATELY: A gastroenterologist can be reached at any hour.  During normal business hours, 8:30 AM to 5:00 PM Monday through Friday, call [phone].  After hours and on weekends, please call the GI answering service at [phone] who will take a message and have the physician on call contact you.   Following lower endoscopy (colonoscopy or flexible sigmoidoscopy):  Excessive amounts of blood in the stool  Significant tenderness or worsening of abdominal pains  Swelling of the abdomen that is new, acute  Fever of 100F or higher  FOLLOW UP: If any biopsies were taken you will be contacted by phone or by letter within the next 1-3 weeks.  Call your gastroenterologist if you have not heard about the biopsies in 3 weeks.  Our staff will call the home number listed on your records the next business day following your procedure to check on you and address any questions or concerns that you may have at that time regarding the information given to you following your procedure. This is a courtesy call and so if there is no answer at the home number and we have not heard from you through the emergency physician on call, we will assume that you have returned to your regular daily activities without incident.  SIGNATURES/CONFIDENTIALITY: You and/or your care partner have signed paperwork which will be entered into your electronic medical record.  These signatures attest to the fact that that the information above on your After Visit Summary  has been reviewed and is understood.  Full responsibility of the confidentiality of this discharge information lies with you and/or your care-partner.

## 2013-11-13 NOTE — Progress Notes (Signed)
Patient denies any allergies to eggs or soy.

## 2013-11-16 ENCOUNTER — Telehealth: Payer: Self-pay | Admitting: *Deleted

## 2013-11-16 LAB — STOOL CULTURE

## 2013-11-16 NOTE — Telephone Encounter (Signed)
Number identifier, left message, follow-up

## 2013-11-20 ENCOUNTER — Other Ambulatory Visit (INDEPENDENT_AMBULATORY_CARE_PROVIDER_SITE_OTHER): Payer: BC Managed Care – PPO

## 2013-11-20 DIAGNOSIS — K529 Noninfective gastroenteritis and colitis, unspecified: Secondary | ICD-10-CM

## 2013-11-20 DIAGNOSIS — K5289 Other specified noninfective gastroenteritis and colitis: Secondary | ICD-10-CM

## 2013-11-20 LAB — CBC WITH DIFFERENTIAL/PLATELET
BASOS ABS: 0 10*3/uL (ref 0.0–0.1)
Basophils Relative: 0.4 % (ref 0.0–3.0)
Eosinophils Absolute: 0.2 10*3/uL (ref 0.0–0.7)
Eosinophils Relative: 2.7 % (ref 0.0–5.0)
HEMATOCRIT: 45.1 % (ref 39.0–52.0)
Hemoglobin: 15.6 g/dL (ref 13.0–17.0)
LYMPHS ABS: 2.3 10*3/uL (ref 0.7–4.0)
Lymphocytes Relative: 25.9 % (ref 12.0–46.0)
MCHC: 34.6 g/dL (ref 30.0–36.0)
MCV: 85.8 fl (ref 78.0–100.0)
MONO ABS: 0.7 10*3/uL (ref 0.1–1.0)
Monocytes Relative: 7.7 % (ref 3.0–12.0)
Neutro Abs: 5.5 10*3/uL (ref 1.4–7.7)
Neutrophils Relative %: 63.3 % (ref 43.0–77.0)
Platelets: 235 10*3/uL (ref 150.0–400.0)
RBC: 5.26 Mil/uL (ref 4.22–5.81)
RDW: 13.5 % (ref 11.5–14.6)
WBC: 8.8 10*3/uL (ref 4.5–10.5)

## 2013-11-20 LAB — COMPREHENSIVE METABOLIC PANEL
ALT: 24 U/L (ref 0–53)
AST: 27 U/L (ref 0–37)
Albumin: 4.1 g/dL (ref 3.5–5.2)
Alkaline Phosphatase: 47 U/L (ref 39–117)
BILIRUBIN TOTAL: 1 mg/dL (ref 0.3–1.2)
BUN: 16 mg/dL (ref 6–23)
CO2: 29 mEq/L (ref 19–32)
CREATININE: 1.1 mg/dL (ref 0.4–1.5)
Calcium: 9.3 mg/dL (ref 8.4–10.5)
Chloride: 103 mEq/L (ref 96–112)
GFR: 81.84 mL/min (ref >60.00)
Glucose, Bld: 90 mg/dL (ref 70–99)
Potassium: 4 mEq/L (ref 3.5–5.1)
Sodium: 139 mEq/L (ref 135–145)
Total Protein: 6.7 g/dL (ref 6.0–8.3)

## 2013-11-24 ENCOUNTER — Telehealth: Payer: Self-pay

## 2013-11-24 DIAGNOSIS — K5190 Ulcerative colitis, unspecified, without complications: Secondary | ICD-10-CM

## 2013-11-24 NOTE — Telephone Encounter (Signed)
Message copied by Barron Alvine on Tue Nov 24, 2013  8:43 AM ------      Message from: Mason Jim, AMY L      Created: Tue Nov 24, 2013  8:31 AM       Pt returning call.  [phone] ------

## 2013-11-24 NOTE — Telephone Encounter (Signed)
Left message on machine to call back  

## 2013-11-24 NOTE — Telephone Encounter (Signed)
Dr Ardis Hughs the pt would like to know if he should continue to taper his prednisone, he is on 35 mg per day now?

## 2013-11-24 NOTE — Telephone Encounter (Signed)
Pt aware ROV scheduled

## 2013-11-24 NOTE — Telephone Encounter (Signed)
Yes, he can decrease by 5 mg every 2 weeks.  Should have rov in 4-5 weeks.

## 2013-11-24 NOTE — Telephone Encounter (Signed)
The labs were all normal, i would like to increase his azathiaprine to 149m per day. He may need new script for this (1523mpo once daily, disp one month, 11 refills). Needs cbc, cmet in 2 weeks.

## 2013-11-27 ENCOUNTER — Other Ambulatory Visit: Payer: Self-pay

## 2013-11-27 ENCOUNTER — Encounter: Payer: Self-pay | Admitting: Gastroenterology

## 2013-11-27 MED ORDER — AZATHIOPRINE 50 MG PO TABS: 150.0000 mg | ORAL_TABLET | Freq: Every day | ORAL | Status: DC

## 2013-12-08 ENCOUNTER — Encounter: Payer: Self-pay | Admitting: Gastroenterology

## 2013-12-08 ENCOUNTER — Telehealth: Payer: Self-pay

## 2013-12-08 NOTE — Telephone Encounter (Signed)
The patient has been notified of this information and all questions answered.

## 2013-12-08 NOTE — Telephone Encounter (Signed)
Message copied by Barron Alvine on Tue Dec 08, 2013  8:12 AM ------      Message from: Barron Alvine      Created: Tue Nov 24, 2013  1:51 PM       Pt to get labs  ------

## 2013-12-09 ENCOUNTER — Other Ambulatory Visit (INDEPENDENT_AMBULATORY_CARE_PROVIDER_SITE_OTHER): Payer: BC Managed Care – PPO

## 2013-12-09 DIAGNOSIS — K5190 Ulcerative colitis, unspecified, without complications: Secondary | ICD-10-CM

## 2013-12-09 LAB — COMPREHENSIVE METABOLIC PANEL
ALT: 35 U/L (ref 0–53)
AST: 29 U/L (ref 0–37)
Albumin: 4.3 g/dL (ref 3.5–5.2)
Alkaline Phosphatase: 70 U/L (ref 39–117)
BILIRUBIN TOTAL: 0.7 mg/dL (ref 0.3–1.2)
BUN: 17 mg/dL (ref 6–23)
CO2: 29 meq/L (ref 19–32)
CREATININE: 1.1 mg/dL (ref 0.4–1.5)
Calcium: 9.3 mg/dL (ref 8.4–10.5)
Chloride: 103 mEq/L (ref 96–112)
GFR: 79.21 mL/min (ref >60.00)
Glucose, Bld: 58 mg/dL — ABNORMAL LOW (ref 70–99)
Potassium: 3.6 mEq/L (ref 3.5–5.1)
Sodium: 140 mEq/L (ref 135–145)
Total Protein: 6.9 g/dL (ref 6.0–8.3)

## 2013-12-09 LAB — CBC WITH DIFFERENTIAL/PLATELET
BASOS ABS: 0 10*3/uL (ref 0.0–0.1)
BASOS PCT: 0.5 % (ref 0.0–3.0)
Eosinophils Absolute: 0.2 10*3/uL (ref 0.0–0.7)
Eosinophils Relative: 1.9 % (ref 0.0–5.0)
HEMATOCRIT: 47.3 % (ref 39.0–52.0)
HEMOGLOBIN: 16.3 g/dL (ref 13.0–17.0)
LYMPHS ABS: 2.3 10*3/uL (ref 0.7–4.0)
Lymphocytes Relative: 27.2 % (ref 12.0–46.0)
MCHC: 34.5 g/dL (ref 30.0–36.0)
MCV: 85.7 fl (ref 78.0–100.0)
MONOS PCT: 5.4 % (ref 3.0–12.0)
Monocytes Absolute: 0.5 10*3/uL (ref 0.1–1.0)
NEUTROS ABS: 5.4 10*3/uL (ref 1.4–7.7)
Neutrophils Relative %: 65 % (ref 43.0–77.0)
Platelets: 323 10*3/uL (ref 150.0–400.0)
RBC: 5.52 Mil/uL (ref 4.22–5.81)
RDW: 14 % (ref 11.5–14.6)
WBC: 8.4 10*3/uL (ref 4.5–10.5)

## 2013-12-16 ENCOUNTER — Encounter: Payer: Self-pay | Admitting: Gastroenterology

## 2013-12-16 DIAGNOSIS — K5289 Other specified noninfective gastroenteritis and colitis: Secondary | ICD-10-CM

## 2013-12-16 NOTE — Telephone Encounter (Signed)
Amy can you help with this pt?  Dr Ardis Hughs is on vacation.

## 2013-12-16 NOTE — Telephone Encounter (Signed)
I will ask Amy Esterwood PA and get back to you.  Dr Ardis Hughs is out of the office until Monday.

## 2013-12-16 NOTE — Telephone Encounter (Signed)
Daniel Bridges gave the verbal order for labs this week CBC and Hepatic panel and decrease back down to 100 mg Azathioprine.  We will contact you after the labs come back.

## 2013-12-21 ENCOUNTER — Other Ambulatory Visit (INDEPENDENT_AMBULATORY_CARE_PROVIDER_SITE_OTHER): Payer: BC Managed Care – PPO

## 2013-12-21 DIAGNOSIS — K5289 Other specified noninfective gastroenteritis and colitis: Secondary | ICD-10-CM

## 2013-12-21 LAB — CBC WITH DIFFERENTIAL/PLATELET
Basophils Absolute: 0 10*3/uL (ref 0.0–0.1)
Basophils Relative: 0.5 % (ref 0.0–3.0)
Eosinophils Absolute: 0.1 10*3/uL (ref 0.0–0.7)
Eosinophils Relative: 0.8 % (ref 0.0–5.0)
HEMATOCRIT: 44.7 % (ref 39.0–52.0)
HEMOGLOBIN: 15.3 g/dL (ref 13.0–17.0)
Lymphocytes Relative: 39.3 % (ref 12.0–46.0)
Lymphs Abs: 3.4 10*3/uL (ref 0.7–4.0)
MCHC: 34.3 g/dL (ref 30.0–36.0)
MCV: 85.6 fl (ref 78.0–100.0)
MONO ABS: 0.6 10*3/uL (ref 0.1–1.0)
Monocytes Relative: 6.9 % (ref 3.0–12.0)
NEUTROS ABS: 4.5 10*3/uL (ref 1.4–7.7)
Neutrophils Relative %: 52.5 % (ref 43.0–77.0)
PLATELETS: 275 10*3/uL (ref 150.0–400.0)
RBC: 5.22 Mil/uL (ref 4.22–5.81)
RDW: 14.3 % (ref 11.5–14.6)
WBC: 8.6 10*3/uL (ref 4.5–10.5)

## 2013-12-21 LAB — HEPATIC FUNCTION PANEL
ALK PHOS: 82 U/L (ref 39–117)
ALT: 104 U/L — ABNORMAL HIGH (ref 0–53)
AST: 44 U/L — AB (ref 0–37)
Albumin: 4 g/dL (ref 3.5–5.2)
BILIRUBIN TOTAL: 0.6 mg/dL (ref 0.3–1.2)
Bilirubin, Direct: 0.1 mg/dL (ref 0.0–0.3)
Total Protein: 6.8 g/dL (ref 6.0–8.3)

## 2013-12-23 ENCOUNTER — Other Ambulatory Visit: Payer: Self-pay

## 2013-12-23 DIAGNOSIS — K5190 Ulcerative colitis, unspecified, without complications: Secondary | ICD-10-CM

## 2013-12-25 ENCOUNTER — Telehealth: Payer: Self-pay

## 2013-12-25 ENCOUNTER — Encounter: Payer: Self-pay | Admitting: Gastroenterology

## 2013-12-25 ENCOUNTER — Ambulatory Visit (INDEPENDENT_AMBULATORY_CARE_PROVIDER_SITE_OTHER): Payer: BC Managed Care – PPO | Admitting: Gastroenterology

## 2013-12-25 ENCOUNTER — Other Ambulatory Visit (INDEPENDENT_AMBULATORY_CARE_PROVIDER_SITE_OTHER): Payer: BC Managed Care – PPO

## 2013-12-25 ENCOUNTER — Other Ambulatory Visit: Payer: Self-pay

## 2013-12-25 VITALS — BP 132/84 | HR 64 | Ht 75.0 in | Wt 212.0 lb

## 2013-12-25 DIAGNOSIS — K5190 Ulcerative colitis, unspecified, without complications: Secondary | ICD-10-CM

## 2013-12-25 LAB — COMPREHENSIVE METABOLIC PANEL
ALK PHOS: 81 U/L (ref 39–117)
ALT: 83 U/L — ABNORMAL HIGH (ref 0–53)
AST: 46 U/L — ABNORMAL HIGH (ref 0–37)
Albumin: 4 g/dL (ref 3.5–5.2)
BILIRUBIN TOTAL: 1 mg/dL (ref 0.3–1.2)
BUN: 14 mg/dL (ref 6–23)
CO2: 29 mEq/L (ref 19–32)
CREATININE: 0.9 mg/dL (ref 0.4–1.5)
Calcium: 9.3 mg/dL (ref 8.4–10.5)
Chloride: 102 mEq/L (ref 96–112)
GFR: 96.5 mL/min (ref >60.00)
Glucose, Bld: 85 mg/dL (ref 70–99)
Potassium: 4.4 mEq/L (ref 3.5–5.1)
SODIUM: 139 meq/L (ref 135–145)
TOTAL PROTEIN: 6.8 g/dL (ref 6.0–8.3)

## 2013-12-25 MED ORDER — MESALAMINE 800 MG PO TBEC: 1600.0000 mg | DELAYED_RELEASE_TABLET | Freq: Three times a day (TID) | ORAL | Status: DC

## 2013-12-25 NOTE — Progress Notes (Signed)
Review of pertinent gastrointestinal problems:  1. left sided ulcerative colitis. Dr. 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 Patient has been treated over the years with oral mesalamine and intermittent mesalamine suppositories. Flared with bleeding, urgency, frequency Spring 2013. Colonoscopy April 2013 showed moderate to severe inflammation up to 25 cm. Biopsies confirmed chronic inflammation. Terminal ileum was normal. He was started on 40 mg prednisone, mesalamine enema (continued on mesalamine orally). May, 2013: Symptoms improved very quickly on prednisone. 04/2013 Flare of symptoms beginning winder 2013/14; saw extender 03/2013; c. Diff negative, started Euceris, continued oral mesalamine; 3 weeks later symptoms a bit worse and put on prednisone 40 with taper, continue oral mesalamine, added rowasa. 9/14 improving, prednisone tapering. 09/2013 flaring again.  Colonoscopy 10/2013 (done to restage disease given failure to respond to steroids as he usually did, showed moderated to severe inflammation up to 25cm, normal TI. Path showed mildly active, chronic colitis, CMV stains negative. Also small TA was removed. TPTM testing level 10 (low metabolizer).  2. Tubular adenoma, subcentimeter colonoscopy 2015   HPI: This is a  very pleasant 43 year old man whom I last saw about 2 months ago.  2 hours after taking imuran, felt  After 3-4 weeks, vomiting started after taking the meds.  HE stopped the imuran a week ago (was off it for 4 days).  Retried it earlier this week, vomited.  The imuran was defintiely helping him.  No bleeding and no diarrhea (after about 4 weeks).   Past Medical History  Diagnosis Date  . Ulcerative proctitis   . Hyperlipidemia   . Depression     Past Surgical History   Procedure Laterality Date  . Colonoscopy      Current Outpatient Prescriptions  Medication Sig Dispense Refill  . FLUoxetine (PROZAC) 20 MG tablet Take 20 mg by mouth daily.      . fluticasone (FLONASE) 50 MCG/ACT nasal spray Place 2 sprays into the nose daily as needed for allergies.       . Multiple Vitamin (MULTIVITAMIN WITH MINERALS) TABS tablet Take 1 tablet by mouth daily.      . predniSONE (DELTASONE) 20 MG tablet Take 1 tablet (20 mg total) by mouth 2 (two) times daily with a meal.  60 tablet  0   No current facility-administered medications for this visit.    Allergies as of 12/25/2013  . (No Known Allergies)    Family History  Problem Relation Age of Onset  . Kidney cancer Mother   . Hypertension Mother   . Aneurysm Father     abdominal aortic  . Colon cancer Paternal Aunt 51    History   Social History  . Marital Status: Married    Spouse Name: N/A    Number of Children: 2  . Years of Education: N/A   Occupational History  . Media planner    Social History Main Topics  . Smoking status: Never Smoker   . Smokeless tobacco: Never Used  . Alcohol Use: 8.4 oz/week    14 Cans of beer per week     Comment: 2 beers per day   . Drug Use: No  . Sexual Activity: Not on file   Other Topics Concern  . Not on file   Social History Narrative  . No narrative on file      Physical Exam: BP 132/84  Pulse 64  Ht 6' 3"  (1.905 m)  Wt 212 lb (96.163 kg)  BMI 26.50 kg/m2 Constitutional: generally well-appearing Psychiatric: alert and oriented x3 Abdomen: soft, nontender, nondistended, no obvious ascites, no peritoneal signs, normal bowel sounds     Assessment and plan: 43 y.o. male with 25 cm of moderate to severe proctocolitis  He is clearly having side effects from his azathioprine and he will stop it for now and I am adding it to his allergy list.  I'm going to try him a second time on mesalamine, he will start asacol hd, 800 mg pills, he'll take 2  pills 3 times daily. He'll return to see me in 4 weeks and sooner if needed. If he starts to have some flare symptoms as he tapers off his prednisone 5 mg per week I will likely get him back on Rowasa enemas. If he fails this mesalamine trial once again then Biologics will be necessary.

## 2013-12-25 NOTE — Telephone Encounter (Signed)
Message sent to pt to schedule appt

## 2013-12-25 NOTE — Telephone Encounter (Signed)
Pt aware and will be in today at 245 pm and have labs prior

## 2013-12-25 NOTE — Patient Instructions (Addendum)
Will restart mesalamine based therapy with asacol HD (823m pills, take 2 pills three times daily) Start tapering your prednisone by 556mper week. Please return to see Dr. JaArdis Hughsn 4 weeks.

## 2013-12-25 NOTE — Telephone Encounter (Signed)
Daniel Bridges, Can you double book him for this afternoon or sometime early next week. He can stop azathiaprine, sounds miserable from it. Would like cmet prior to his rov.

## 2014-01-05 ENCOUNTER — Ambulatory Visit: Payer: BC Managed Care – PPO | Admitting: Gastroenterology

## 2014-02-23 ENCOUNTER — Encounter: Payer: Self-pay | Admitting: Gastroenterology

## 2014-02-23 ENCOUNTER — Ambulatory Visit (INDEPENDENT_AMBULATORY_CARE_PROVIDER_SITE_OTHER): Payer: BC Managed Care – PPO | Admitting: Gastroenterology

## 2014-02-23 VITALS — BP 120/80 | HR 68 | Ht 75.0 in | Wt 212.8 lb

## 2014-02-23 DIAGNOSIS — K5190 Ulcerative colitis, unspecified, without complications: Secondary | ICD-10-CM

## 2014-02-23 NOTE — Progress Notes (Signed)
Review of pertinent gastrointestinal problems:  1. left sided ulcerative colitis. Dr. 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 Patient has been treated over the years with oral mesalamine and intermittent mesalamine suppositories. Flared with bleeding, urgency, frequency Spring 2013. Colonoscopy April 2013 showed moderate to severe inflammation up to 25 cm. Biopsies confirmed chronic inflammation. Terminal ileum was normal. He was started on 40 mg prednisone, mesalamine enema (continued on mesalamine orally). May, 2013: Symptoms improved very quickly on prednisone. 04/2013 Flare of symptoms beginning winder 2013/14; saw extender 03/2013; c. Diff negative, started Euceris, continued oral mesalamine; 3 weeks later symptoms a bit worse and put on prednisone 40 with taper, continue oral mesalamine, added rowasa. 9/14 improving, prednisone tapering. 09/2013 flaring again. Colonoscopy 10/2013 (done to restage disease given failure to respond to steroids as he usually did, showed moderated to severe inflammation up to 25cm, normal TI. Path showed mildly active, chronic colitis, CMV stains negative. Also small TA was removed. TPTM testing level 10 (low metabolizer).  12/2013 very clearly intolerant of azathiaprine (flu like severe symptoms, reliably when he took it), stopped the med and started second mesalamine trial (asacol 1600 tid).  June, 2015 on a sick all dosing full-strength 3 times a day his symptoms completely resolved. 2. Tubular adenoma, subcentimeter colonoscopy 2015   HPI: This is a very pleasant 43 year old man that I saw 2 months ago. Since then he has been on full-strength a cervical 3 times daily and he is doing very well. His symptoms of colitis have completely resolved.  2 pills tid, no bleeding.  Diarrhea  is gone.     Past Medical History  Diagnosis Date  . Ulcerative proctitis   . Hyperlipidemia   . Depression     Past Surgical History  Procedure Laterality Date  . Colonoscopy      Current Outpatient Prescriptions  Medication Sig Dispense Refill  . FLUoxetine (PROZAC) 20 MG tablet Take 20 mg by mouth daily.      . fluticasone (FLONASE) 50 MCG/ACT nasal spray Place 2 sprays into the nose daily as needed for allergies.       . Mesalamine 800 MG TBEC Take 2 tablets (1,600 mg total) by mouth 3 (three) times daily.  180 tablet  6  . Multiple Vitamin (MULTIVITAMIN WITH MINERALS) TABS tablet Take 1 tablet by mouth daily.       No current facility-administered medications for this visit.    Allergies as of 02/23/2014 - Review Complete 02/23/2014  Allergen Reaction Noted  . Imuran [azathioprine]  12/25/2013    Family History  Problem Relation Age of Onset  . Kidney cancer Mother   . Hypertension Mother   . Aneurysm Father     abdominal aortic  . Colon cancer Paternal Aunt 60    History   Social History  . Marital Status: Married    Spouse Name: N/A    Number of Children: 2  . Years of Education: N/A   Occupational History  . Media planner    Social History Main Topics  . Smoking status: Never Smoker   . Smokeless tobacco: Never Used  . Alcohol Use: 8.4 oz/week    14 Cans of beer per week     Comment: 2 beers per day   . Drug Use: No  . Sexual Activity: Not on file   Other Topics Concern  .  Not on file   Social History Narrative  . No narrative on file      Physical Exam: BP 120/80  Pulse 68  Ht 6' 3"  (1.905 m)  Wt 212 lb 12.8 oz (96.525 kg)  BMI 26.60 kg/m2 Constitutional: generally well-appearing Psychiatric: alert and oriented x3 Abdomen: soft, nontender, nondistended, no obvious ascites, no peritoneal signs, normal bowel sounds     Assessment and plan: 43 y.o. male with distal ulcerative colitis  He'll continue on asacol 800 mg 3  times a day. He'll return to see me in 6 months and sooner if needed.

## 2014-02-23 NOTE — Patient Instructions (Signed)
Continue on asacol 863m, take 2 pills three times daily. Please return to see Dr. JArdis Hughsin 6 months, sooner if any troubles.

## 2014-04-15 ENCOUNTER — Encounter: Payer: Self-pay | Admitting: Gastroenterology

## 2014-04-16 MED ORDER — PREDNISONE 10 MG PO TABS: 10.0000 mg | ORAL_TABLET | Freq: Two times a day (BID) | ORAL | Status: DC

## 2014-04-16 NOTE — Telephone Encounter (Signed)
Please advise 

## 2014-04-16 NOTE — Telephone Encounter (Signed)
Patty, can you call him.  OK to start prednisone 16m pills, take 1 pill twice daily for 3-4 weeks, call here to discuss tapering at that point.

## 2014-04-16 NOTE — Telephone Encounter (Signed)
rx has been sent and pt to be notified

## 2014-05-10 ENCOUNTER — Telehealth: Payer: Self-pay | Admitting: Gastroenterology

## 2014-05-11 NOTE — Telephone Encounter (Signed)
Left message on machine to call back  

## 2014-05-12 NOTE — Telephone Encounter (Signed)
The pt was given instructions per my chart

## 2014-05-18 ENCOUNTER — Ambulatory Visit (INDEPENDENT_AMBULATORY_CARE_PROVIDER_SITE_OTHER): Payer: BC Managed Care – PPO | Admitting: Gastroenterology

## 2014-05-18 ENCOUNTER — Other Ambulatory Visit (INDEPENDENT_AMBULATORY_CARE_PROVIDER_SITE_OTHER): Payer: BC Managed Care – PPO

## 2014-05-18 ENCOUNTER — Encounter: Payer: Self-pay | Admitting: Gastroenterology

## 2014-05-18 VITALS — BP 128/82 | HR 60 | Ht 75.0 in | Wt 219.2 lb

## 2014-05-18 DIAGNOSIS — K5190 Ulcerative colitis, unspecified, without complications: Secondary | ICD-10-CM

## 2014-05-18 DIAGNOSIS — K51919 Ulcerative colitis, unspecified with unspecified complications: Secondary | ICD-10-CM

## 2014-05-18 LAB — CBC WITH DIFFERENTIAL/PLATELET
BASOS ABS: 0.1 10*3/uL (ref 0.0–0.1)
Basophils Relative: 0.8 % (ref 0.0–3.0)
EOS PCT: 0.1 % (ref 0.0–5.0)
Eosinophils Absolute: 0 10*3/uL (ref 0.0–0.7)
HCT: 47.3 % (ref 39.0–52.0)
Hemoglobin: 16.4 g/dL (ref 13.0–17.0)
LYMPHS PCT: 13 % (ref 12.0–46.0)
Lymphs Abs: 1.3 10*3/uL (ref 0.7–4.0)
MCHC: 34.7 g/dL (ref 30.0–36.0)
MCV: 86.5 fl (ref 78.0–100.0)
Monocytes Absolute: 0.6 10*3/uL (ref 0.1–1.0)
Monocytes Relative: 5.4 % (ref 3.0–12.0)
NEUTROS PCT: 80.7 % — AB (ref 43.0–77.0)
Neutro Abs: 8.2 10*3/uL — ABNORMAL HIGH (ref 1.4–7.7)
PLATELETS: 269 10*3/uL (ref 150.0–400.0)
RBC: 5.47 Mil/uL (ref 4.22–5.81)
RDW: 14 % (ref 11.5–15.5)
WBC: 10.1 10*3/uL (ref 4.0–10.5)

## 2014-05-18 LAB — COMPREHENSIVE METABOLIC PANEL
ALT: 51 U/L (ref 0–53)
AST: 63 U/L — ABNORMAL HIGH (ref 0–37)
Albumin: 4.4 g/dL (ref 3.5–5.2)
Alkaline Phosphatase: 54 U/L (ref 39–117)
BUN: 15 mg/dL (ref 6–23)
CO2: 30 mEq/L (ref 19–32)
Calcium: 10.1 mg/dL (ref 8.4–10.5)
Chloride: 101 mEq/L (ref 96–112)
Creatinine, Ser: 1.1 mg/dL (ref 0.4–1.5)
GFR: 79.9 mL/min (ref >60.00)
Glucose, Bld: 99 mg/dL (ref 70–99)
Potassium: 5.3 mEq/L — ABNORMAL HIGH (ref 3.5–5.1)
Sodium: 139 mEq/L (ref 135–145)
Total Bilirubin: 0.7 mg/dL (ref 0.2–1.2)
Total Protein: 7.7 g/dL (ref 6.0–8.3)

## 2014-05-18 NOTE — Patient Instructions (Addendum)
Stay on prednisone 55m once daily. Hep B surface antigen, Hep B surface antibody testing. TB testing today. C. Difficile by PCR today.  If this is negative, then will start:  Humira 1652mSC day 1 then Humira 8019mC day 15 then Humira 68m77m every 2 weeks starting day 29. You will have labs checked today in the basement lab.  Please head down after you check out with the front desk  (cbc, cmet)

## 2014-05-18 NOTE — Progress Notes (Signed)
Review of pertinent gastrointestinal problems:  1. left sided ulcerative colitis. Dr. 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 Patient has been treated over the years with oral mesalamine and intermittent mesalamine suppositories. Flared with bleeding, urgency, frequency Spring 2013. Colonoscopy April 2013 showed moderate to severe inflammation up to 25 cm. Biopsies confirmed chronic inflammation. Terminal ileum was normal. He was started on 40 mg prednisone, mesalamine enema (continued on mesalamine orally). May, 2013: Symptoms improved very quickly on prednisone. 04/2013 Flare of symptoms beginning winder 2013/14; saw extender 03/2013; c. Diff negative, started Euceris, continued oral mesalamine; 3 weeks later symptoms a bit worse and put on prednisone 40 with taper, continue oral mesalamine, added rowasa. 9/14 improving, prednisone tapering. 09/2013 flaring again. Colonoscopy 10/2013 (done to restage disease given failure to respond to steroids as he usually did, showed moderated to severe inflammation up to 25cm, normal TI. Path showed mildly active, chronic colitis, CMV stains negative. Also small TA was removed. TPTM testing level 10 (low metabolizer). 12/2013 very clearly intolerant of azathiaprine (flu like severe symptoms, reliably when he took it), stopped the med and started second mesalamine trial (asacol 1600 tid). June, 2015 on asacoldosing full-strength 3 times a day his symptoms completely resolved.  2. Tubular adenoma, subcentimeter colonoscopy 2015  HPI: This is a very pleasant 43 year old man whom I last saw to 3 months ago.  He had been doing well on asacol 2 pills 3 times daily however he went to the beach over the summer about a month ago and since then he has had flare of bloody diarrhea,  frequency.  He has had only mild left-sided lower abdominal discomfort. No fevers or chills.  3 weeks ago he started on prednisone 20 mg a day call back about 10 days later with 0 improvement. He has been on 40 mg a day for 10 days and he has still noticed no improvement at all in his bloody diarrhea, urgency, frequency.   Has been on asacol 2 pills 3 times daily.  Continues with urgency, bloody diarrhea.  Past Medical History  Diagnosis Date  . Ulcerative proctitis   . Hyperlipidemia   . Depression     Past Surgical History  Procedure Laterality Date  . Colonoscopy      Current Outpatient Prescriptions  Medication Sig Dispense Refill  . FLUoxetine (PROZAC) 20 MG tablet Take 20 mg by mouth daily.      . fluticasone (FLONASE) 50 MCG/ACT nasal spray Place 2 sprays into the nose daily as needed for allergies.       . Mesalamine 800 MG TBEC Take 2 tablets (1,600 mg total) by mouth 3 (three) times daily.  180 tablet  6  . predniSONE (DELTASONE) 10 MG tablet Take 20 mg by mouth 2 (two) times daily.       No current facility-administered medications for this visit.    Allergies as of 05/18/2014 - Review Complete 05/18/2014  Allergen Reaction Noted  . Imuran [azathioprine]  12/25/2013    Family History  Problem Relation Age of Onset  . Kidney cancer Mother   . Hypertension Mother   . Aneurysm Father     abdominal aortic  . Colon cancer Paternal Aunt 70    History   Social History  . Marital Status: Married    Spouse Name: N/A    Number of Children: 2  .  Years of Education: N/A   Occupational History  . Media planner    Social History Main Topics  . Smoking status: Never Smoker   . Smokeless tobacco: Never Used  . Alcohol Use: 8.4 oz/week    14 Cans of beer per week     Comment: 2 beers per day   . Drug Use: No  . Sexual Activity: Not on file   Other Topics Concern  . Not on file   Social History Narrative  . No narrative on file      Physical  Exam: BP 128/82  Pulse 60  Ht 6' 3"  (1.905 m)  Wt 219 lb 3.2 oz (99.428 kg)  BMI 27.40 kg/m2 Constitutional: generally well-appearing Psychiatric: alert and oriented x3 Abdomen: soft, nontender, nondistended, no obvious ascites, no peritoneal signs, normal bowel sounds     Assessment and plan: 43 y.o. male with   left-sided colitis, now with flare of bloody diarrhea, frequency, urgency that is not responding to even high-dose steroids.  Perhaps superinfection with Clostridium difficile he'll get tested for this expeditiously. It may be that his disease is simply failing to respond to steroids now. He has proven to be an intolerant of immunomodulators with severe flulike illness. We discussed advancing him to Biologics. We discussed Remicade and Humira and I recommended that we try Humira since it is easier to administer. He'll have hepatitis B testing, TB testing, will start working with his insurance company for preauthorization for Humira at 160, 80, 40 mg dosing. He'll continue on prednisone 40 mg daily for now.

## 2014-05-19 LAB — HEPATITIS B SURFACE ANTIBODY,QUALITATIVE: Hep B S Ab: NEGATIVE

## 2014-05-19 LAB — HEPATITIS B SURFACE ANTIGEN: HEP B S AG: NEGATIVE

## 2014-05-20 LAB — TB SKIN TEST
INDURATION: 0 mm
TB SKIN TEST: NEGATIVE

## 2014-05-24 ENCOUNTER — Telehealth: Payer: Self-pay | Admitting: *Deleted

## 2014-05-25 ENCOUNTER — Other Ambulatory Visit: Payer: Self-pay | Admitting: Gastroenterology

## 2014-05-25 NOTE — Telephone Encounter (Signed)
Call was placed to the pt to confirm he has been called about his remicade. [facility] handles the [employer] as well and should have him set up for training.

## 2014-05-26 ENCOUNTER — Other Ambulatory Visit: Payer: Self-pay | Admitting: Gastroenterology

## 2014-05-26 MED ORDER — PREDNISONE 20 MG PO TABS: 40.0000 mg | ORAL_TABLET | Freq: Every day | ORAL | Status: DC

## 2014-05-27 ENCOUNTER — Other Ambulatory Visit: Payer: BC Managed Care – PPO

## 2014-05-27 DIAGNOSIS — K51919 Ulcerative colitis, unspecified with unspecified complications: Secondary | ICD-10-CM

## 2014-05-28 LAB — CLOSTRIDIUM DIFFICILE BY PCR: Toxigenic C. Difficile by PCR: NOT DETECTED

## 2014-06-03 ENCOUNTER — Telehealth: Payer: Self-pay | Admitting: Gastroenterology

## 2014-06-03 NOTE — Telephone Encounter (Signed)
The pt is aware and will call with any further concerns

## 2014-06-03 NOTE — Telephone Encounter (Signed)
Please advise 

## 2014-06-03 NOTE — Telephone Encounter (Signed)
Can you call him in response to his email.  She can stop the mesalamine (asacol).  I don't want him to start tapering steroids until next week and then only by 47m every week.  If he is not already scheduled for rov with me in 2-3 weeks, he needs to be, double book if needed.  Can you verify his questions about humira being mailed home.  I don't think he needs to come in for the first shots if he feels like he understands it well.

## 2014-06-30 ENCOUNTER — Ambulatory Visit (INDEPENDENT_AMBULATORY_CARE_PROVIDER_SITE_OTHER): Payer: BC Managed Care – PPO | Admitting: Gastroenterology

## 2014-06-30 ENCOUNTER — Encounter: Payer: Self-pay | Admitting: Gastroenterology

## 2014-06-30 VITALS — BP 136/98 | HR 60 | Ht 75.0 in | Wt 220.8 lb

## 2014-06-30 DIAGNOSIS — K5190 Ulcerative colitis, unspecified, without complications: Secondary | ICD-10-CM

## 2014-06-30 NOTE — Patient Instructions (Signed)
Continue humira, 31m every 2 weeks. Continue tapering prednisone. Please return to see Dr. JArdis Hughsin 2-3 months, sooner if needed. Check out [facility] Website if needed.

## 2014-06-30 NOTE — Progress Notes (Signed)
Review of pertinent gastrointestinal problems:  1. left sided ulcerative colitis. Dr. 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 Patient has been treated over the years with oral mesalamine and intermittent mesalamine suppositories. Flared with bleeding, urgency, frequency Spring 2013. Colonoscopy April 2013 showed moderate to severe inflammation up to 25 cm. Biopsies confirmed chronic inflammation. Terminal ileum was normal. He was started on 40 mg prednisone, mesalamine enema (continued on mesalamine orally). May, 2013: Symptoms improved very quickly on prednisone. 04/2013 Flare of symptoms beginning winder 2013/14; saw extender 03/2013; c. Diff negative, started Euceris, continued oral mesalamine; 3 weeks later symptoms a bit worse and put on prednisone 40 with taper, continue oral mesalamine, added rowasa. 9/14 improving, prednisone tapering. 09/2013 flaring again. Colonoscopy 10/2013 (done to restage disease given failure to respond to steroids as he usually did, showed moderated to severe inflammation up to 25cm, normal TI. Path showed mildly active, chronic colitis, CMV stains negative. Also small TA was removed. TPTM testing level 10 (low metabolizer). 12/2013 very clearly intolerant of azathiaprine (flu like severe symptoms, reliably when he took it), stopped the med and started second mesalamine trial (asacol 1600 tid). June, 2015 on asacoldosing full-strength 3 times a day his symptoms completely resolved. Symptoms improved briefly only, he was back on prednisone again. September 2015 he was initiated on Biologics, humira  (05/2014 Hep B neg, TB skin test neg) 2. Tubular adenoma, subcentimeter colonoscopy 2015   HPI: This is a very pleasant 43 year old man whom I last saw about a month ago.  Started humira  160, then 80.  Is due for maintanence next week.  The bleeding has stopped, stools formed now, still minor urgency.   He really is feeling much better overall  Past Medical History  Diagnosis Date  . Ulcerative proctitis   . Hyperlipidemia   . Depression     Past Surgical History  Procedure Laterality Date  . Colonoscopy      Current Outpatient Prescriptions  Medication Sig Dispense Refill  . predniSONE (DELTASONE) 20 MG tablet Take 2 tablets (40 mg total) by mouth daily with breakfast.  30 tablet  3  . HUMIRA PEN 40 MG/0.8ML PNKT        No current facility-administered medications for this visit.    Allergies as of 06/30/2014 - Review Complete 06/30/2014  Allergen Reaction Noted  . Imuran [azathioprine]  12/25/2013    Family History  Problem Relation Age of Onset  . Kidney cancer Mother   . Hypertension Mother   . Aneurysm Father     abdominal aortic  . Colon cancer Paternal Aunt 18    History   Social History  . Marital Status: Married    Spouse Name: N/A    Number of Children: 2  . Years of Education: N/A   Occupational History  . Media planner    Social History Main Topics  . Smoking status: Never Smoker   . Smokeless tobacco: Never Used  . Alcohol Use: 8.4 oz/week    14 Cans of beer per week     Comment: 2 beers per day   . Drug Use: No  . Sexual Activity: Not on file   Other Topics Concern  . Not on file   Social History Narrative  . No narrative on file      Physical Exam: BP 136/98  Pulse 60  Ht 6' 3"  (1.905 m)  Wt 220 lb 12.8 oz (100.154 kg)  BMI 27.60 kg/m2 Constitutional: generally well-appearing Psychiatric: alert and oriented x3 Abdomen: soft, nontender, nondistended, no obvious ascites, no peritoneal signs, normal bowel sounds     Assessment and plan: 43 y.o. male with ulcerative colitis  His symptoms are under much better control on Biologics medicine which she started about a month ago. He will continue on Humira  40 mg every other week. He'll continue tapering his prednisone by 5 mg every week. He'll return to see me in 2-3 months and sooner if needed.

## 2014-07-12 ENCOUNTER — Ambulatory Visit: Payer: BC Managed Care – PPO | Admitting: Gastroenterology

## 2014-07-30 ENCOUNTER — Ambulatory Visit: Payer: BC Managed Care – PPO | Admitting: Gastroenterology

## 2014-10-05 ENCOUNTER — Telehealth: Payer: Self-pay | Admitting: Gastroenterology

## 2014-10-05 NOTE — Telephone Encounter (Signed)
Yes, please have him delay it by 1 week.  thanks

## 2014-10-05 NOTE — Telephone Encounter (Signed)
Dr Ardis Hughs should we reschedule the humira?

## 2014-10-05 NOTE — Telephone Encounter (Signed)
Pt aware and will call with future concerns

## 2014-10-21 ENCOUNTER — Telehealth: Payer: Self-pay | Admitting: Gastroenterology

## 2014-10-22 ENCOUNTER — Encounter: Payer: Self-pay | Admitting: Nurse Practitioner

## 2014-10-22 ENCOUNTER — Ambulatory Visit (INDEPENDENT_AMBULATORY_CARE_PROVIDER_SITE_OTHER): Payer: BLUE CROSS/BLUE SHIELD | Admitting: Nurse Practitioner

## 2014-10-22 VITALS — BP 120/74 | HR 64 | Ht 75.0 in | Wt 216.2 lb

## 2014-10-22 DIAGNOSIS — R197 Diarrhea, unspecified: Secondary | ICD-10-CM

## 2014-10-22 DIAGNOSIS — K51919 Ulcerative colitis, unspecified with unspecified complications: Secondary | ICD-10-CM

## 2014-10-22 DIAGNOSIS — K5190 Ulcerative colitis, unspecified, without complications: Secondary | ICD-10-CM | POA: Insufficient documentation

## 2014-10-22 MED ORDER — METRONIDAZOLE 500 MG PO TABS: 500.0000 mg | ORAL_TABLET | Freq: Three times a day (TID) | ORAL | Status: DC

## 2014-10-22 NOTE — Progress Notes (Signed)
     History of Present Illness:   Patient is a 44 year old male known to Dr. Ardis Hughs. He has a history of left-sided ulcerative colitis. Patient was started on Humira in September and has done quite well up until just recently. In mid January patient was diagnosed with pneumonia for which he was given a ten-day course of antibiotics. Within 2 days being on antibiotics patient suddenly developed bloody diarrhea. He had no significant abdominal pain. No fevers. We advised patient to postpone his 10/07/14 Humira injection until the following week. Patient therefore resumed Humira on 10/14/14. He has continued to have bloody diarrhea    Current Medications, Allergies, Past Medical History, Past Surgical History, Family History and Social History were reviewed in [employer] record.   Physical Exam: General: Pleasant, well developed , white male in no acute distress Head: Normocephalic and atraumatic Eyes:  sclerae anicteric, conjunctiva pink  Ears: Normal auditory acuity Lungs: Clear throughout to auscultation Heart: Regular rate and rhythm Abdomen: Soft, non distended, non-tender. No masses, no hepatomegaly. Normal bowel sounds Musculoskeletal: Symmetrical with no gross deformities  Extremities: No edema  Neurological: Alert oriented x 4, grossly nonfocal Psychological:  Alert and cooperative. Normal mood and affect  Assessment and Recommendations:  44 year old male with left-sided UC,  on Humira since Sept 2015. Patient was doing well until approximately 3 weeks ago when he developed acute bloody diarrhea while taking antibiotics for pneumonia. Last Humira injection was 10/14/14.   We will check stool for C. Difficile.  Treat empirically wiith Flagyl 500 mg 3 times a day for 14 days  Patient will call us on Monday (today is Friday) with a condition up-to-date. Hopefully we will have C. difficile study back by then. Depending on C. difficile result and clinical course  patient may need a course of prednisone.

## 2014-10-22 NOTE — Telephone Encounter (Signed)
To recap recent events, The patient had pneumonia, was put on ATB's and had to delay his Humeria injection until 10/14/14. His next Humeria injection is 10/28/14. He calls with bloody stools of about 7-8 before noon. He feels sore. He is taking OTC anti-diarrheal medication. Feels he is having a flare and he blames the ATB's. Please advise on what to do.

## 2014-10-22 NOTE — Telephone Encounter (Signed)
See if he can come in for rov with extender (backed by me, I'll plan to see him as well)  If he cannot, he at least needs stool testing (c. Diff by PCR, stool pathogen panel, cbc, bmet).

## 2014-10-22 NOTE — Patient Instructions (Addendum)
Your physician has requested that you go to the basement for the following lab work before leaving today: C Diff by PCR  We have sent the following medications to your pharmacy for you to pick up at your convenience: Flagyl 500 mg three times daily x 14 days  Please call our office if not better by Monday or Tuesday. If you are not better, we may need to start you on steroids. Our phone number is [phone].  CC: Dr Orpah Melter

## 2014-10-25 ENCOUNTER — Other Ambulatory Visit: Payer: BLUE CROSS/BLUE SHIELD

## 2014-10-25 DIAGNOSIS — R197 Diarrhea, unspecified: Secondary | ICD-10-CM

## 2014-10-25 NOTE — Progress Notes (Signed)
________________________________________________________________________  Velora Heckler GI MD note:  I personally examined the patient, reviewed the data and agree with the assessment and plan described above.

## 2014-10-26 ENCOUNTER — Telehealth: Payer: Self-pay | Admitting: Gastroenterology

## 2014-10-26 LAB — CLOSTRIDIUM DIFFICILE BY PCR: CDIFFPCR: NOT DETECTED

## 2014-10-27 ENCOUNTER — Other Ambulatory Visit: Payer: Self-pay

## 2014-10-27 NOTE — Telephone Encounter (Signed)
Daniel Bridges, per other communication today can you contact him.  Start him on pred 69m twice daily, rov with you or me in 1 week.  Should still complete his flagyl course.  Thanks

## 2014-10-27 NOTE — Telephone Encounter (Signed)
FYI Dr Ardis Hughs no changes in condition

## 2014-11-04 ENCOUNTER — Encounter: Payer: Self-pay | Admitting: Nurse Practitioner

## 2014-11-04 DIAGNOSIS — K51911 Ulcerative colitis, unspecified with rectal bleeding: Secondary | ICD-10-CM

## 2014-11-04 DIAGNOSIS — R197 Diarrhea, unspecified: Secondary | ICD-10-CM

## 2014-11-04 NOTE — Telephone Encounter (Signed)
Sheri, Can you also order adalubimab 1)drug level and 2)antibody testing: should be a 'trough' level and so should be drawn on march 1st or second.  Thanks

## 2014-11-04 NOTE — Telephone Encounter (Signed)
Message     Daniel Bridges, Daniel Bridges is not doing much better on flagyl and prednisone. Can you schedule a CTscan of abd/pelvis with contrast for restaging of his disease. He knows you will be calling to arrange this. He also needs a GI pathogen panel.     Thanks    Nevin Bloodgood        ----- Message -----     From: Milus Banister, MD     Sent: 11/04/2014 12:44 PM      To: Willia Craze, NP    Subject: RE: Non-Urgent Medical Question               Nevin Bloodgood,    I think we can restage his disease with CT scan abd/pelvis with IV and oral contrast, hopefully today or tomorrow. Send the results to me so I can call him. (he just had a full colonoscopy less than a year ago so trying to avoid repeating it now). Also full GI pathogen panel    Thanks            ----- Message -----     From: Willia Craze, NP     Sent: 11/04/2014 11:59 AM      To: Milus Banister, MD    Subject: FW: Non-Urgent Medical Question               Fabiola Backer sent me an update. Doesn't sound like he is a lot better. Do you want to look at colon again? He has appointment with you in office 3/8. Thanks        ----- Message -----     From: Kellie Moor, RN     Sent: 11/04/2014 10:03 AM      To: Willia Craze, NP    Subject: FW: Non-Urgent Medical Question               See message from the patient and advise    ----- Message -----     From: [name]     Sent: 11/04/2014  9:33 AM      To: [facility]    Subject: Non-Urgent Medical Question                 Nevin Bloodgood - Just updating you on the progress of the colitis flare with taking Flagyl and Prednison. The only improvement I've noticed is the frequency of going to the bathroom. I still have very bloody stools in the morning with moderate diarrhea. As the day goes on it gets a little better and but still generally  uncomfortable with reduced blood in the stools. I finished the Flagyl and continue to take 63m of Prednison along with Humira every other week. My next Humira dose is scheduled for March 3 and I have a standing appointment with Dr. JArdis Hughson March 8.         Thanks,        [name]([phone]       I spoke with the patient and he is notified of the CT scheduled for 11/05/14 10:45.  He is aware to come pick up contrast and instructions today as well as go to the lab

## 2014-11-05 ENCOUNTER — Ambulatory Visit (INDEPENDENT_AMBULATORY_CARE_PROVIDER_SITE_OTHER)
Admission: RE | Admit: 2014-11-05 | Discharge: 2014-11-05 | Disposition: A | Discharge status: Home / Self Care | Payer: BLUE CROSS/BLUE SHIELD | Source: Ambulatory Visit | Attending: Nurse Practitioner | Admitting: Nurse Practitioner

## 2014-11-05 DIAGNOSIS — R197 Diarrhea, unspecified: Secondary | ICD-10-CM

## 2014-11-05 DIAGNOSIS — K51911 Ulcerative colitis, unspecified with rectal bleeding: Secondary | ICD-10-CM

## 2014-11-05 MED ORDER — IOHEXOL 300 MG/ML  SOLN
100.0000 mL | Freq: Once | INTRAMUSCULAR | Status: AC | PRN
  Administered 2014-11-05: 100 mL via INTRAVENOUS

## 2014-11-08 ENCOUNTER — Other Ambulatory Visit: Payer: BLUE CROSS/BLUE SHIELD

## 2014-11-08 DIAGNOSIS — R197 Diarrhea, unspecified: Secondary | ICD-10-CM

## 2014-11-08 DIAGNOSIS — K51911 Ulcerative colitis, unspecified with rectal bleeding: Secondary | ICD-10-CM

## 2014-11-09 ENCOUNTER — Telehealth: Payer: Self-pay

## 2014-11-09 DIAGNOSIS — K50119 Crohn's disease of large intestine with unspecified complications: Secondary | ICD-10-CM

## 2014-11-09 DIAGNOSIS — K51919 Ulcerative colitis, unspecified with unspecified complications: Secondary | ICD-10-CM

## 2014-11-09 MED ORDER — MESALAMINE 4 G RE ENEM: 4.0000 g | ENEMA | Freq: Every day | RECTAL | Status: DC

## 2014-11-09 NOTE — Telephone Encounter (Signed)
Daniel Bridges, see below. He's noticed improvement in frequency, down to 4 bloody stools day (from 7-8).         Daniel Bridges,    I spoke with Shanon Brow. He is going to start tapering prednisone by 41m per week. He is going to restart rowasa enema. Needs new prescription; 1 enema at bedtime nightly, disp 30 with 6 refills. He is actually getting next humira shot this week (on Thursday 3rd). He needs adalubimab (humira) drug level and also antibody level tomorrow AM or Wednesday AM.         Thanks                    ----- Message -----     From: PWillia Craze NP     Sent: 11/09/2014 10:29 AM      To: [name] MD        [name], do you want to add anything? I told DJohnneythat CTscan didn't really tell u[place]anything new. Go ahead with scheduled humira and keep upcoming appointment with you. I will check on stool pathogen results.     Thanks

## 2014-11-09 NOTE — Telephone Encounter (Signed)
Labs in [place] and rowasa sent to the pharmacy

## 2014-11-10 ENCOUNTER — Other Ambulatory Visit: Payer: BLUE CROSS/BLUE SHIELD

## 2014-11-10 DIAGNOSIS — K51919 Ulcerative colitis, unspecified with unspecified complications: Secondary | ICD-10-CM

## 2014-11-12 LAB — OTHER SOLSTAS TEST: Miscellaneous Test: 65008

## 2014-11-16 ENCOUNTER — Ambulatory Visit (INDEPENDENT_AMBULATORY_CARE_PROVIDER_SITE_OTHER): Payer: BLUE CROSS/BLUE SHIELD | Admitting: Gastroenterology

## 2014-11-16 ENCOUNTER — Encounter: Payer: Self-pay | Admitting: Gastroenterology

## 2014-11-16 VITALS — BP 134/86 | HR 74 | Ht 75.0 in | Wt 213.0 lb

## 2014-11-16 DIAGNOSIS — K51919 Ulcerative colitis, unspecified with unspecified complications: Secondary | ICD-10-CM

## 2014-11-16 NOTE — Patient Instructions (Addendum)
Start one imodium every morning shortly after waking. Continue steroid taper. Will contact your about humira labs. Please return to see Dr. Ardis Hughs on 12/28/14 at 930 am, sooner if needed.

## 2014-11-16 NOTE — Progress Notes (Signed)
Review of pertinent gastrointestinal problems:  1. left sided ulcerative colitis. Dr. 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 Patient has been treated over the years with oral mesalamine and intermittent mesalamine suppositories. Flared with bleeding, urgency, frequency Spring 2013. Colonoscopy April 2013 showed moderate to severe inflammation up to 25 cm. Biopsies confirmed chronic inflammation. Terminal ileum was normal. He was started on 40 mg prednisone, mesalamine enema (continued on mesalamine orally). May, 2013: Symptoms improved very quickly on prednisone. 04/2013 Flare of symptoms beginning winder 2013/14; saw extender 03/2013; c. Diff negative, started Euceris, continued oral mesalamine; 3 weeks later symptoms a bit worse and put on prednisone 40 with taper, continue oral mesalamine, added rowasa. 9/14 improving, prednisone tapering. 09/2013 flaring again. Colonoscopy 10/2013 (done to restage disease given failure to respond to steroids as he usually did, showed moderated to severe inflammation up to 25cm, normal TI. Path showed mildly active, chronic colitis, CMV stains negative. Also small TA was removed. TPTM testing level 10 (low metabolizer). 12/2013 very clearly intolerant of azathiaprine (flu like severe symptoms, reliably when he took it), stopped the med and started second mesalamine trial (asacol 1600 tid). June, 2015 on asacoldosing full-strength 3 times a day his symptoms completely resolved. Symptoms improved briefly only, he was back on prednisone again. September 2015 he was initiated on Biologics, humira 10/2014 Flare after abx for pneumonia; empiric flagyl did not help, C. Diff PCR neg, GI pathogen panel neg; repeat CT showed on distal disease still; steroids restarted, rowasa restarted, eventual  improvement.  Adalimumab antibodies and trough level stent, still pending.  (05/2014 Hep B neg, TB skin test neg) 2. Tubular adenoma, subcentimeter colonoscopy 2015  HPI: This is a  very pleasant 44 year old man whom I last saw about a month ago.  Last bleeding was 4-5 days ago.  Had started enemas 2 days prior.  Diarrhea, stool 3-4 times per day, most in AM.  Nocturnal 3-4 days ago.  Much improved.  He is due for next Humira dose in 9 days.  Past Medical History  Diagnosis Date  . Ulcerative proctitis   . Hyperlipidemia   . Depression     Past Surgical History  Procedure Laterality Date  . Colonoscopy      Current Outpatient Prescriptions  Medication Sig Dispense Refill  . HUMIRA PEN 40 MG/0.8ML PNKT 1 pen every 14 days    . mesalamine (ROWASA) 4 G enema Place 60 mLs (4 g total) rectally at bedtime. 30 Bottle 6  . predniSONE (DELTASONE) 20 MG tablet Take 1 tablet by mouth daily. Taper  2   No current facility-administered medications for this visit.    Allergies as of 11/16/2014 - Review Complete 11/16/2014  Allergen Reaction Noted  . Imuran [azathioprine]  12/25/2013    Family History  Problem Relation Age of Onset  . Kidney cancer Mother   . Hypertension Mother   . Aneurysm Father     abdominal aortic  . Colon cancer Paternal Aunt 51  . Clotting disorder Paternal Uncle     History   Social History  . Marital Status: Married    Spouse Name: N/A  . Number of Children: 2  . Years of Education: N/A   Occupational History  . Media planner    Social History Main Topics  . Smoking status: Never Smoker   . Smokeless tobacco: Never Used  .  Alcohol Use: 8.4 oz/week    14 Cans of beer per week     Comment: 2 beers per day   . Drug Use: No  . Sexual Activity: Not on file   Other Topics Concern  . Not on file   Social History Narrative      Physical Exam: BP 134/86 mmHg  Pulse 74  Ht 6' 3"  (1.905 m)  Wt 213 lb (96.616 kg)  BMI 26.62  kg/m2 Constitutional: generally well-appearing Psychiatric: alert and oriented x3 Abdomen: soft, nontender, nondistended, no obvious ascites, no peritoneal signs, normal bowel sounds     Assessment and plan: 44 y.o. male with distal colitis flare  He will continue tapering off of prednisone. He will continue Rowasa enemas nightly. I am awaiting his lab results from his Humira trough drug level, antibody check. I may need to adjust his Humira dose based on that. He will start a single Imodium on a scheduled basis every morning. He will return to see me in 6 weeks and sooner if needed.

## 2014-11-18 LAB — ADALIMUMAB+AB (SERIAL MONITOR): ANTI-ADALIMUMAB ANTIBODY: 611 ng/mL — AB

## 2014-11-22 ENCOUNTER — Other Ambulatory Visit: Payer: Self-pay

## 2014-11-22 ENCOUNTER — Telehealth: Payer: Self-pay | Admitting: Gastroenterology

## 2014-11-22 DIAGNOSIS — K51918 Ulcerative colitis, unspecified with other complication: Secondary | ICD-10-CM

## 2014-11-22 DIAGNOSIS — K51919 Ulcerative colitis, unspecified with unspecified complications: Secondary | ICD-10-CM

## 2014-11-22 NOTE — Telephone Encounter (Signed)
Per lab result note pt is to be switched to Remicade, [facility] aware.

## 2014-11-25 ENCOUNTER — Other Ambulatory Visit (HOSPITAL_COMMUNITY): Payer: Self-pay | Admitting: *Deleted

## 2014-11-25 ENCOUNTER — Encounter (HOSPITAL_COMMUNITY): Payer: BLUE CROSS/BLUE SHIELD

## 2014-11-25 ENCOUNTER — Encounter: Payer: Self-pay | Admitting: Gastroenterology

## 2014-11-26 ENCOUNTER — Encounter (HOSPITAL_COMMUNITY): Payer: BLUE CROSS/BLUE SHIELD

## 2014-12-28 ENCOUNTER — Ambulatory Visit: Payer: BLUE CROSS/BLUE SHIELD | Admitting: Gastroenterology

## 2016-08-14 IMAGING — CT CT ABD-PELV W/ CM
2 of 5 series · 17 of 46 positions shown, 19 images · IV contrast (omnipaque)
Comparison: None.

CLINICAL DATA: Ulcerative colitis with rectal bleeding. Abdominal
pain. Diarrhea

EXAM:
CT ABDOMEN AND PELVIS WITH CONTRAST
TECHNIQUE: Multidetector CT imaging of the abdomen and pelvis was performed
using the standard protocol following bolus administration of
intravenous contrast.
CONTRAST:  100mL OMNIPAQUE IOHEXOL 300 MG/ML  SOLN

[Series 2: abd/ pel 5mm · axial · 0.70mm/px · z∈[-534,-84]mm · 14 of 102 slices shown, 16 images]
[im 6/102  soft-tissue]
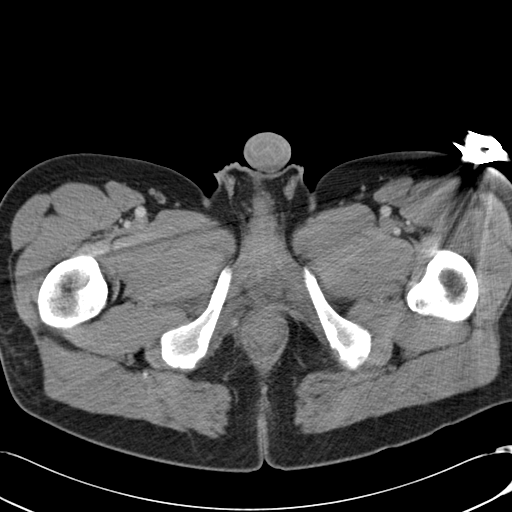
[im 6/102  bone]
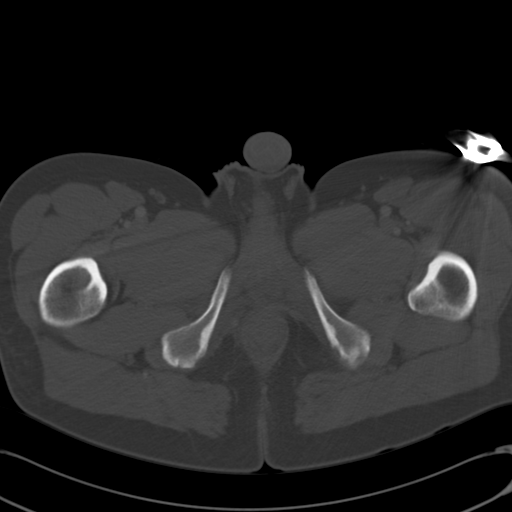
[im 16/102  soft-tissue]
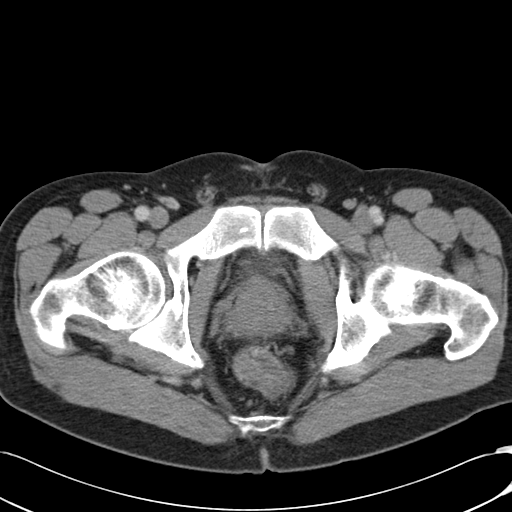
[im 21/102  soft-tissue]
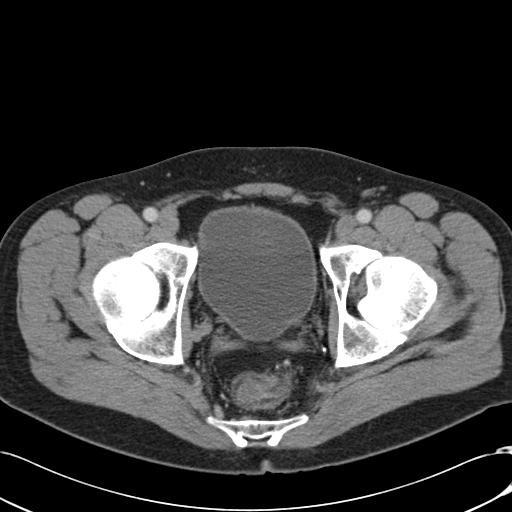
[im 26/102  soft-tissue]
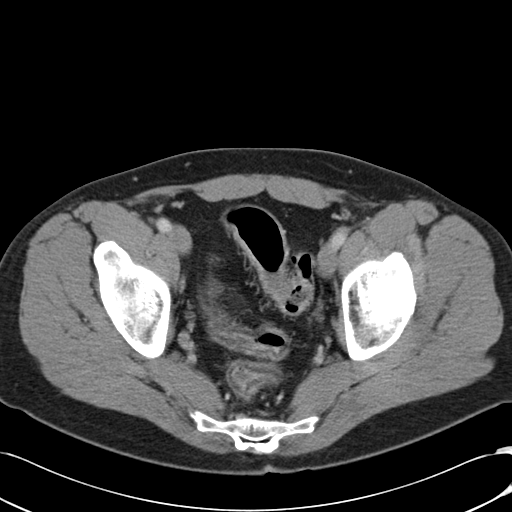
[im 36/102  soft-tissue]
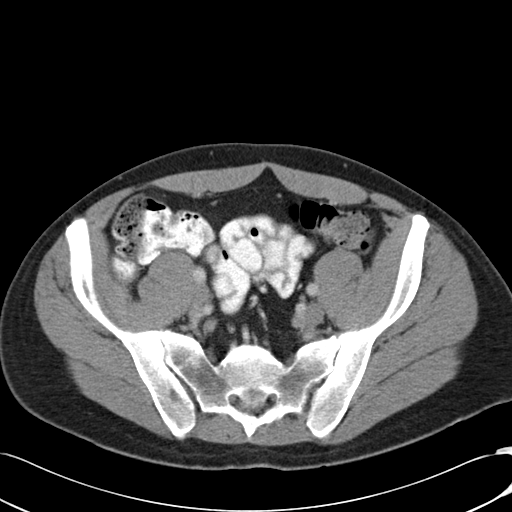
[im 41/102  soft-tissue]
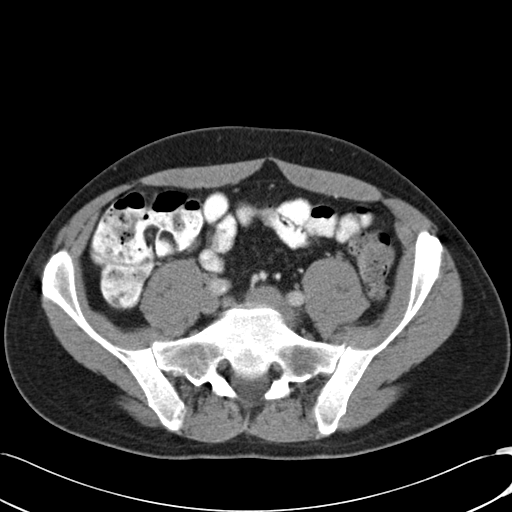
[im 46/102  soft-tissue]
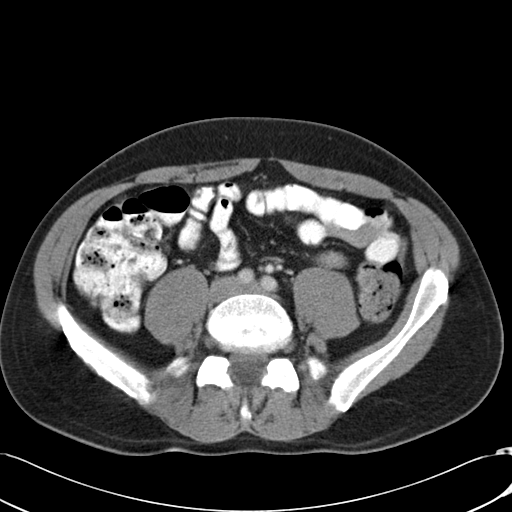
[im 56/102  soft-tissue]
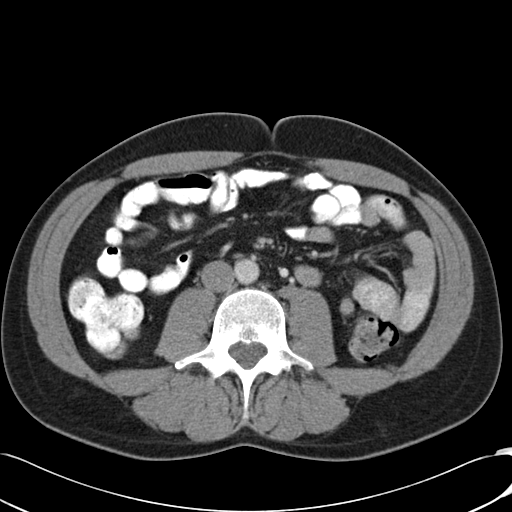
[im 61/102  soft-tissue]
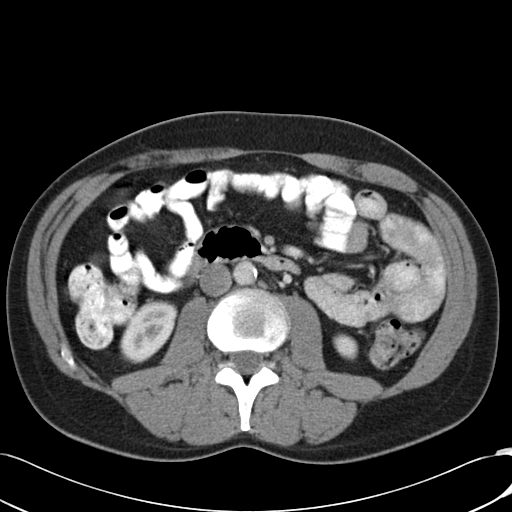
[im 61/102  bone]
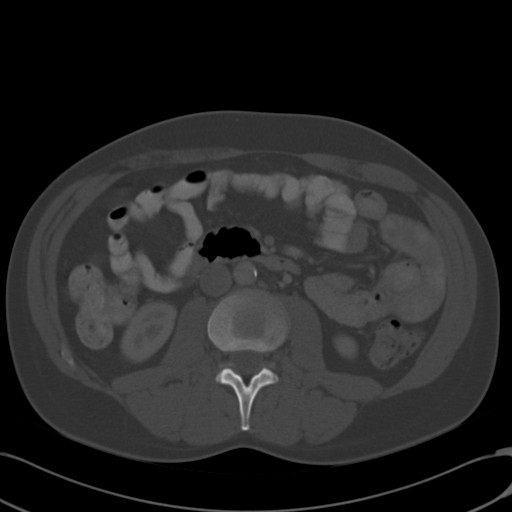
[im 66/102  soft-tissue]
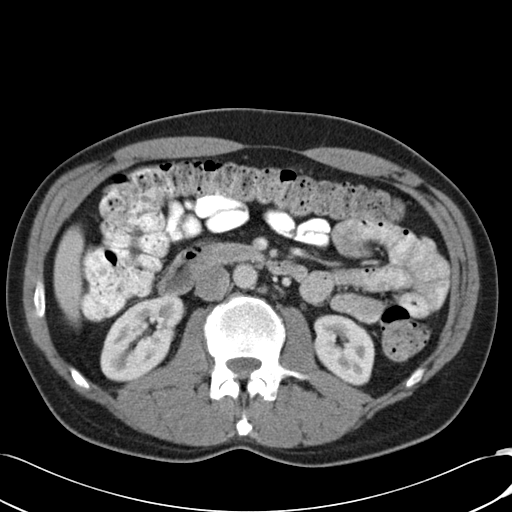
[im 76/102  soft-tissue]
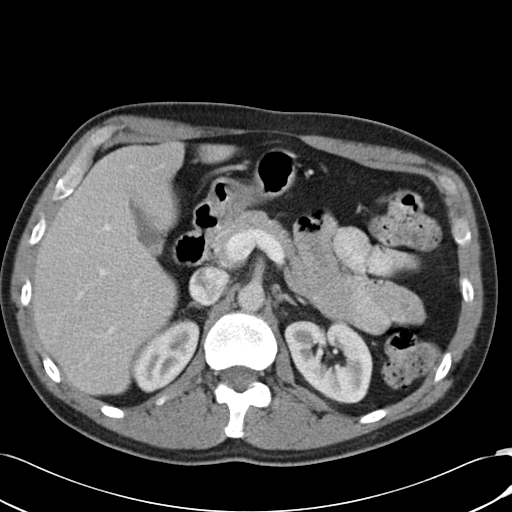
[im 81/102  soft-tissue]
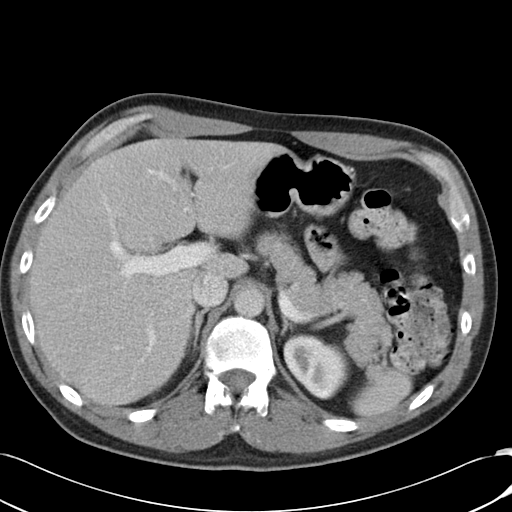
[im 86/102  soft-tissue]
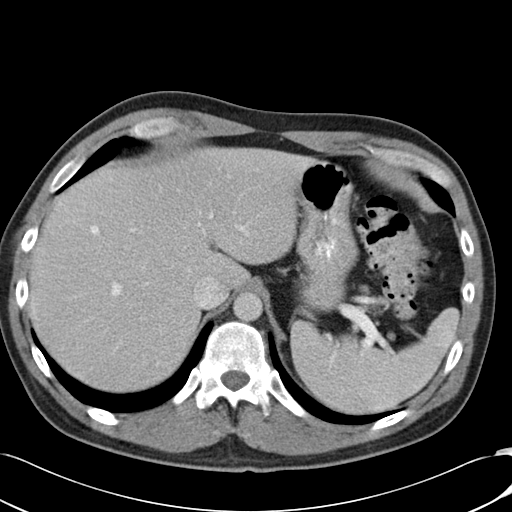
[im 96/102  soft-tissue]
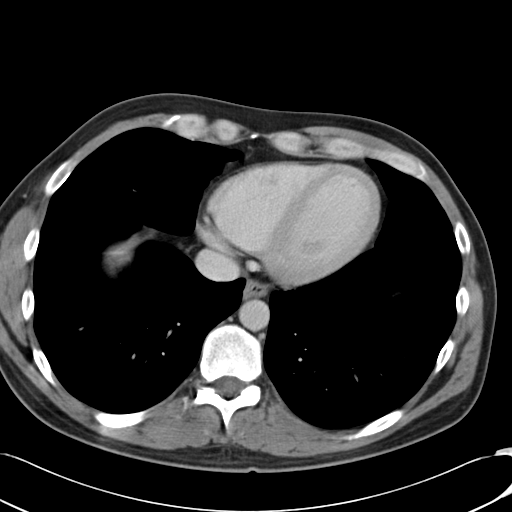

[Series 602: cor · coronal · 1.02mm/px · 3 of 117 slices shown]
[im 39/117  soft-tissue]
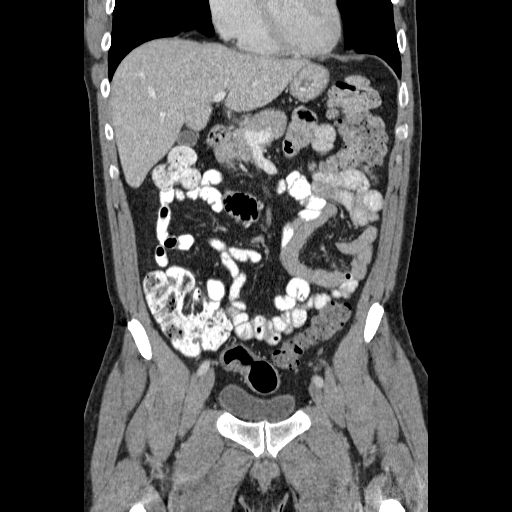
[im 52/117  soft-tissue]
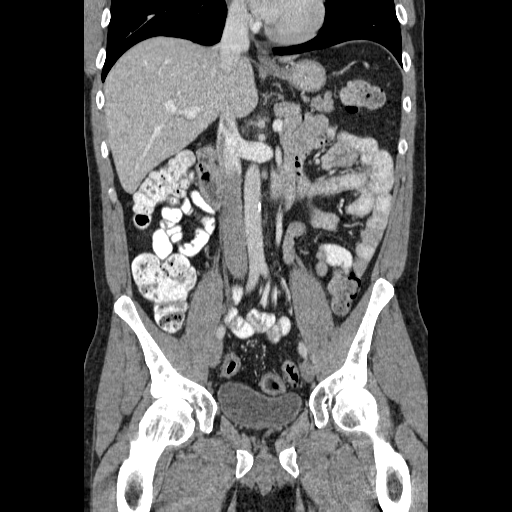
[im 65/117  soft-tissue]
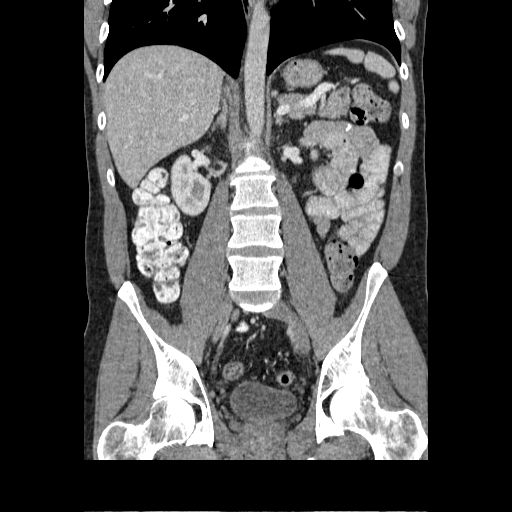

[17 of 46 positions shown; findings below may reference images not displayed]

FINDINGS: Lower Chest:  Unremarkable.

Hepatobiliary: No masses or other significant abnormality
identified. Gallbladder is unremarkable.

Pancreas: No mass, inflammatory changes, or other significant
abnormality identified.

Spleen:  Within normal limits in size and appearance.

Adrenals:  No masses identified.

Kidneys/Urinary Tract:  No evidence of masses or hydronephrosis.

Stomach/Bowel/Peritoneum: No evidence of dilated bowel loops. Fatty
infiltration and loss of normal haustral fold pattern is seen
involving the rectum and distal sigmoid colon, consistent with
chronic ulcerative colitis. No acute inflammatory changes are seen
in the perirectal or pericolonic fat. No evidence of abscess or free
fluid. Remainder of colon and small bowel bowel is unremarkable in
appearance.

Vascular/Lymphatic: No pathologically enlarged lymph nodes
identified. No other significant abnormality visualized.

Reproductive:  No mass or other significant abnormality identified.

Other:  None.

Musculoskeletal:  No suspicious bone lesions identified.
IMPRESSION: Chronic appearing changes of ulcerative colitis involving the rectum
and distal sigmoid colon. No definite radiographic findings of acute
colitis. No abscess or other complication identified.

## 2018-08-27 ENCOUNTER — Ambulatory Visit: Payer: BC Managed Care – PPO | Admitting: Nurse Practitioner

## 2018-08-27 ENCOUNTER — Encounter: Payer: Self-pay | Admitting: Nurse Practitioner

## 2018-08-27 ENCOUNTER — Other Ambulatory Visit (INDEPENDENT_AMBULATORY_CARE_PROVIDER_SITE_OTHER): Payer: BC Managed Care – PPO

## 2018-08-27 VITALS — BP 122/68 | HR 74 | Ht 75.0 in | Wt 218.0 lb

## 2018-08-27 DIAGNOSIS — Z86010 Personal history of colonic polyps: Secondary | ICD-10-CM

## 2018-08-27 DIAGNOSIS — K5190 Ulcerative colitis, unspecified, without complications: Secondary | ICD-10-CM | POA: Diagnosis not present

## 2018-08-27 LAB — BASIC METABOLIC PANEL
BUN: 15 mg/dL (ref 6–23)
CO2: 30 mEq/L (ref 19–32)
CREATININE: 1.02 mg/dL (ref 0.40–1.50)
Calcium: 9.5 mg/dL (ref 8.4–10.5)
Chloride: 102 mEq/L (ref 96–112)
GFR: 82.85 mL/min (ref >60.00)
Glucose, Bld: 85 mg/dL (ref 70–99)
Potassium: 4.4 mEq/L (ref 3.5–5.1)
Sodium: 138 mEq/L (ref 135–145)

## 2018-08-27 LAB — CBC WITH DIFFERENTIAL/PLATELET
Basophils Absolute: 0.1 10*3/uL (ref 0.0–0.1)
Basophils Relative: 1.1 % (ref 0.0–3.0)
Eosinophils Absolute: 0.2 10*3/uL (ref 0.0–0.7)
Eosinophils Relative: 1.9 % (ref 0.0–5.0)
HCT: 45.1 % (ref 39.0–52.0)
Hemoglobin: 15.9 g/dL (ref 13.0–17.0)
LYMPHS ABS: 2.5 10*3/uL (ref 0.7–4.0)
Lymphocytes Relative: 29.5 % (ref 12.0–46.0)
MCHC: 35.3 g/dL (ref 30.0–36.0)
MCV: 84.6 fl (ref 78.0–100.0)
Monocytes Absolute: 0.7 10*3/uL (ref 0.1–1.0)
Monocytes Relative: 8.1 % (ref 3.0–12.0)
NEUTROS PCT: 59.4 % (ref 43.0–77.0)
Neutro Abs: 5 10*3/uL (ref 1.4–7.7)
PLATELETS: 287 10*3/uL (ref 150.0–400.0)
RBC: 5.33 Mil/uL (ref 4.22–5.81)
RDW: 13.1 % (ref 11.5–15.5)
WBC: 8.5 10*3/uL (ref 4.0–10.5)

## 2018-08-27 LAB — SEDIMENTATION RATE: Sed Rate: 6 mm/hr (ref 0–15)

## 2018-08-27 LAB — C-REACTIVE PROTEIN: CRP: 1.1 mg/dL (ref 0.5–20.0)

## 2018-08-27 MED ORDER — MESALAMINE 4 G RE ENEM: 4.0000 g | ENEMA | Freq: Every day | RECTAL | 3 refills | Status: DC

## 2018-08-27 MED ORDER — PREDNISONE 10 MG PO TABS: ORAL_TABLET | ORAL | 0 refills | Status: AC

## 2018-08-27 MED ORDER — MESALAMINE 1.2 G PO TBEC: 4.8000 g | DELAYED_RELEASE_TABLET | Freq: Every day | ORAL | 3 refills | Status: DC

## 2018-08-27 MED ORDER — PEG 3350-KCL-NA BICARB-NACL 420 G PO SOLR: 4000.0000 mL | ORAL | 0 refills | Status: DC

## 2018-08-27 NOTE — Progress Notes (Signed)
Chief Complaint:  Colitis flare    IMPRESSION and PLAN:    25.  47 year old male with longstanding left-sided UC.  Previously treated with oral and topical mesalamine as well as periodic courses of prednisone.  Did not tolerate Imuran, TPMT shows  He is low metabolizer.  Was in process of initiating Biologics 3 years ago when symptoms totally resolved.  Patient remained off meds and doing well until this past October.  Now with recurrent flare symptoms in the form of diarrhea with blood.  -Need to check stool for C. difficile.  No recent antibiotics but UC is risk factor for it -Check basic labs including basic metabolic profile, CBC, also check inflammatory markers CRP and ESR -Short course of prednisone starting at 40 mg daily x5 days then decrease by 10 mg every 5 days -Mesalamine 1.2 g, 4 tablets daily. -Continue Rowasa enemas every night. Hopefully prednisone will start helping within a few days making it easier for him to retain the enema.  -Patient due for surveillance colonoscopy in March 2020 so given the circumstances we will go ahead and proceed earlier.  We will look to get this done sometime in January . The risks and benefits of colonoscopy with possible polypectomy were discussed and the patient agrees to proceed.  -Until diarrhea subsides I advised patient to stay on a low fiber diet.  When in remission then high-fiber diet recommended  2. Hx of adenomatous colon polyps.  Surveillance colonoscopy moved up by couple months.  Will do it in January  HPI:     Patient is a 47 year old male Dr. Ardis Hughs.  He came to [place] ~ 2013 with a hx of UC based on colonoscopy in 2004 by Dr. Vladimir Faster.  He has previously been treated with courses of Prednisone, oral and topical mesalamine. After failing those he was started on Imuran but didn't tolerate it and TPMT level was only 10 (low metabolizer).  He had a restaging colonoscopy March 2015 which showed a fairly normal right colon as well as  majority of left colon.  There was moderate to severe inflammation from the anal verge up to about 25 cm. Path showed mildly active, chronic colitis, CMV stains negative. Plan was to start biologics but while trying to determine the cost of drug his colitis symptoms subsided and he did well off meds for the following 3 years.    In October Daniel Bridges developed flare type symptoms again.  He started having numerous episodes of diarrhea with blood every days.  No associated abdominal pain, body just feels achy at times.  Weight stable.  No recent antibiotic use.  He had about 12 Rowasa enemas at home and has been using those with some improvement in symptoms.  The problem is that many of the times he has been unable to retain the enemas.  He wished from using the enemas at bedtime to using them in the morning after he had emptied out bowels   Review of systems:     No chest pain, no SOB, no fevers, no urinary sx   Past Medical History:  Diagnosis Date  . Depression   . Hyperlipidemia   . Ulcerative proctitis ([place])     Patient's surgical history, family medical history, social history, medications and allergies were all reviewed in Epic   Creatinine clearance cannot be calculated (Patient's most recent lab result is older than the maximum 21 days allowed.)  Current Outpatient Medications  Medication Sig Dispense Refill  .  Cholecalciferol (VITAMIN D) 50 MCG (2000 UT) tablet Take 2,000 Units by mouth daily.    Marland Kitchen FLUoxetine (PROZAC) 10 MG capsule Take 10 mg by mouth daily.    Marland Kitchen loperamide (IMODIUM) 2 MG capsule Take by mouth daily as needed for diarrhea or loose stools.    . mesalamine (ROWASA) 4 G enema Place 60 mLs (4 g total) rectally at bedtime. 30 Bottle 6   No current facility-administered medications for this visit.     Physical Exam:     BP 122/68   Pulse 74   Ht _0  (1.905 m)   Wt 218 lb (98.9 kg)   BMI 27.25 kg/m   GENERAL:  Pleasant male in NAD PSYCH: : Cooperative, normal  affect EENT:  conjunctiva pink, mucous membranes moist, neck supple without masses CARDIAC:  RRR, no murmur heard, no peripheral edema PULM: Normal respiratory effort, lungs CTA bilaterally, no wheezing ABDOMEN:  Nondistended, soft, nontender. No obvious masses,  normal bowel sounds SKIN:  turgor, no lesions seen Musculoskeletal:  Normal muscle tone, normal strength NEURO: Alert and oriented x 3, no focal neurologic deficits   Tye Savoy , NP 08/27/2018, 3:21 PM

## 2018-08-28 NOTE — Progress Notes (Signed)
I agree with the above note, plan 

## 2018-08-29 ENCOUNTER — Other Ambulatory Visit: Payer: BC Managed Care – PPO

## 2018-08-29 DIAGNOSIS — K5190 Ulcerative colitis, unspecified, without complications: Secondary | ICD-10-CM

## 2018-08-30 LAB — CLOSTRIDIUM DIFFICILE TOXIN B, QUALITATIVE, REAL-TIME PCR: CDIFFPCR: NOT DETECTED

## 2018-09-09 ENCOUNTER — Encounter: Payer: Self-pay | Admitting: Gastroenterology

## 2018-09-17 ENCOUNTER — Encounter: Payer: Self-pay | Admitting: Gastroenterology

## 2018-09-17 ENCOUNTER — Ambulatory Visit (AMBULATORY_SURGERY_CENTER): Payer: BC Managed Care – PPO | Admitting: Gastroenterology

## 2018-09-17 VITALS — BP 120/82 | HR 64 | Temp 98.0°F | Resp 18 | Ht 75.0 in | Wt 218.0 lb

## 2018-09-17 DIAGNOSIS — D120 Benign neoplasm of cecum: Secondary | ICD-10-CM | POA: Diagnosis not present

## 2018-09-17 DIAGNOSIS — K529 Noninfective gastroenteritis and colitis, unspecified: Secondary | ICD-10-CM

## 2018-09-17 DIAGNOSIS — K5190 Ulcerative colitis, unspecified, without complications: Secondary | ICD-10-CM

## 2018-09-17 MED ORDER — SODIUM CHLORIDE 0.9 % IV SOLN: 500.0000 mL | Freq: Once | INTRAVENOUS | Status: DC

## 2018-09-17 NOTE — Progress Notes (Signed)
PT taken to PACU. Monitors in place. VSS. Report given to RN. 

## 2018-09-17 NOTE — Patient Instructions (Signed)
You had one polyp removed today. Handouts given on polyps. Continue Lialda 1.2gms 4 pills once daily. Go back to office to see Dr Ardis Hughs in 4 weeks. His nurse will call you tomorrow with this appt.   YOU HAD AN ENDOSCOPIC PROCEDURE TODAY AT [facility]:   Refer to the procedure report that was given to you for any specific questions about what was found during the examination.  If the procedure report does not answer your questions, please call your gastroenterologist to clarify.  If you requested that your care partner not be given the details of your procedure findings, then the procedure report has been included in a sealed envelope for you to review at your convenience later.  YOU SHOULD EXPECT: Some feelings of bloating in the abdomen. Passage of more gas than usual.  Walking can help get rid of the air that was put into your GI tract during the procedure and reduce the bloating. If you had a lower endoscopy (such as a colonoscopy or flexible sigmoidoscopy) you may notice spotting of blood in your stool or on the toilet paper. If you underwent a bowel prep for your procedure, you may not have a normal bowel movement for a few days.  Please Note:  You might notice some irritation and congestion in your nose or some drainage.  This is from the oxygen used during your procedure.  There is no need for concern and it should clear up in a day or so.  SYMPTOMS TO REPORT IMMEDIATELY:   Following lower endoscopy (colonoscopy or flexible sigmoidoscopy):  Excessive amounts of blood in the stool  Significant tenderness or worsening of abdominal pains  Swelling of the abdomen that is new, acute  Fever of 100F or higher   For urgent or emergent issues, a gastroenterologist can be reached at any hour by calling [phone].   DIET:  We do recommend a small meal at first, but then you may proceed to your regular diet.  Drink plenty of fluids but you should avoid alcoholic  beverages for 24 hours.  ACTIVITY:  You should plan to take it easy for the rest of today and you should NOT DRIVE or use heavy machinery until tomorrow (because of the sedation medicines used during the test).    FOLLOW UP: Our staff will call the number listed on your records the next business day following your procedure to check on you and address any questions or concerns that you may have regarding the information given to you following your procedure. If we do not reach you, we will leave a message.  However, if you are feeling well and you are not experiencing any problems, there is no need to return our call.  We will assume that you have returned to your regular daily activities without incident.  If any biopsies were taken you will be contacted by phone or by letter within the next 1-3 weeks.  Please call us at [phone] if you have not heard about the biopsies in 3 weeks.    SIGNATURES/CONFIDENTIALITY: You and/or your care partner have signed paperwork which will be entered into your electronic medical record.  These signatures attest to the fact that that the information above on your After Visit Summary has been reviewed and is understood.  Full responsibility of the confidentiality of this discharge information lies with you and/or your care-partner.

## 2018-09-17 NOTE — Op Note (Addendum)
[name] Patient Name: Daniel Bridges Procedure Date: 09/17/2018 10:01 AM MRN: [id number] Endoscopist: Milus Banister , MD Age: 48 Referring MD:  Date of Birth: 13-Jun-1971 Gender: Male Account #: [id number] Procedure:                Colonoscopy Indications:              Ulcerative colitis; proctitis Medicines:                Monitored Anesthesia Care Procedure:                Pre-Anesthesia Assessment:                           - Prior to the procedure, a History and Physical                            was performed, and patient medications and                            allergies were reviewed. The patient's tolerance of                            previous anesthesia was also reviewed. The risks                            and benefits of the procedure and the sedation                            options and risks were discussed with the patient.                            All questions were answered, and informed consent                            was obtained. Prior Anticoagulants: The patient has                            taken no previous anticoagulant or antiplatelet                            agents. ASA Grade Assessment: II - A patient with                            mild systemic disease. After reviewing the risks                            and benefits, the patient was deemed in                            satisfactory condition to undergo the procedure.                           After obtaining informed consent, the colonoscope  was passed under direct vision. Throughout the                            procedure, the patient's blood pressure, pulse, and                            oxygen saturations were monitored continuously. The                            Colonoscope was introduced through the anus and                            advanced to the the terminal ileum. The colonoscopy                            was performed without  difficulty. The patient                            tolerated the procedure well. The quality of the                            bowel preparation was good. The terminal ileum,                            ileocecal valve, appendiceal orifice, and rectum                            were photographed. Scope In: 10:03:00 AM Scope Out: 10:19:01 AM Scope Withdrawal Time: 0 hours 11 minutes 17 seconds  Total Procedure Duration: 0 hours 16 minutes 1 second  Findings:                 The terminal ileum appeared normal.                           A 1 mm polyp was found in the cecum. The polyp was                            sessile. The polyp was removed with a cold biopsy                            forceps. Resection and retrieval were complete (jar                            1)                           The distal colon was moderately inflamed with a                            distinct transition to normal at 20cm from the                            anus. Biopsies were taken from normal mucosa in  right colon (jar 2) and also affected mucosa in                            rectosigmoid (jar 3).                           The exam was otherwise without abnormality on                            direct and retroflexion views. Complications:            No immediate complications. Estimated blood loss:                            None. Estimated Blood Loss:     Estimated blood loss: none. Impression:               - The examined portion of the ileum was normal.                           - One 1 mm polyp in the cecum, removed with a cold                            biopsy forceps. Resected and retrieved.                           - The distal colon was moderately inflamed with a                            distinct transition to normal at 20cm from the                            anus. Biopsies taken from normal appearing mucosa                            and affected (rectosigmoid)  mucosa.                           - The examination was otherwise normal on direct                            and retroflexion views. Recommendation:           - Patient has a contact number available for                            emergencies. The signs and symptoms of potential                            delayed complications were discussed with the                            patient. Return to normal activities tomorrow.  Written discharge instructions were provided to the                            patient.                           - Resume previous diet.                           - Continue present medications. Lialda 1.2gm pills,                            4 pills once daily. Rowasa enemas once nightly.                           - Await pathology results.                           - Return office visit with Dr. Ardis Hughs in 4 weeks                            (office to double book if necessary). Milus Banister, MD 09/17/2018 10:29:49 AM This report has been signed electronically.

## 2018-09-17 NOTE — Progress Notes (Signed)
Called to room to assist during endoscopic procedure.  Patient ID and intended procedure confirmed with present staff. Received instructions for my participation in the procedure from the performing physician.  

## 2018-09-18 ENCOUNTER — Telehealth: Payer: Self-pay

## 2018-09-18 NOTE — Telephone Encounter (Signed)
  Follow up Call-  Call back number 09/17/2018  Post procedure Call Back phone  # [phone]  Permission to leave phone message Yes  Some recent data might be hidden     Patient questions:  Do you have a fever, pain , or abdominal swelling? No. Pain Score  0 *  Have you tolerated food without any problems? Yes.    Have you been able to return to your normal activities? Yes.    Do you have any questions about your discharge instructions: Diet   No. Medications  No. Follow up visit  No.  Do you have questions or concerns about your Care? No.  Actions: * If pain score is 4 or above: No action needed, pain <4.

## 2018-10-17 ENCOUNTER — Encounter: Payer: Self-pay | Admitting: Gastroenterology

## 2018-10-17 ENCOUNTER — Ambulatory Visit: Payer: BC Managed Care – PPO | Admitting: Gastroenterology

## 2018-10-17 VITALS — BP 140/80 | HR 76 | Ht 75.0 in | Wt 221.4 lb

## 2018-10-17 DIAGNOSIS — K5130 Ulcerative (chronic) rectosigmoiditis without complications: Secondary | ICD-10-CM | POA: Diagnosis not present

## 2018-10-17 NOTE — Progress Notes (Signed)
Review of pertinent gastrointestinal problems:  1. left sided ulcerative colitis. Dr. 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 Patient has been treated over the years with oral mesalamine and intermittent mesalamine suppositories. Flared with bleeding, urgency, frequency Spring 2013. Colonoscopy April 2013 showed moderate to severe inflammation up to 25 cm. Biopsies confirmed chronic inflammation. Terminal ileum was normal. He was started on 40 mg prednisone, mesalamine enema (continued on mesalamine orally). May, 2013: Symptoms improved very quickly on prednisone. 04/2013 Flare of symptoms beginning winder 2013/14; saw extender 03/2013; c. Diff negative, started Euceris, continued oral mesalamine; 3 weeks later symptoms a bit worse and put on prednisone 40 with taper, continue oral mesalamine, added rowasa. 9/14 improving, prednisone tapering. 09/2013 flaring again. Colonoscopy 10/2013 (done to restage disease given failure to respond to steroids as he usually did, showed moderated to severe inflammation up to 25cm, normal TI. Path showed mildly active, chronic colitis, CMV stains negative. Also small TA was removed. TPTM testing level 10 (low metabolizer). 12/2013 very clearly intolerant of azathiaprine (flu like severe symptoms, reliably when he took it), stopped the med and started second mesalamine trial (asacol 1600 tid). June, 2015 on asacol dosing full-strength 3 times a day his symptoms completely resolved. Symptoms improved briefly only, he was back on prednisone again. September 2015 he was initiated on Biologics, humira 10/2014 Flare after abx for pneumonia; empiric flagyl did not help, C. Diff PCR neg, GI pathogen panel neg; repeat CT showed on distal disease still; steroids restarted, rowasa restarted, eventual  improvement.  Adalimumab antibodies and trough level sent, still pending.  Several years lost to follow-up because he was feeling well, not taking any medicines.  Resurfaced late 2019 with flare-like symptoms.  Underwent colonoscopy January 2020.  Moderately inflamed colon up to 20 cm from the anus.  Also a single subcentimeter adenoma in the cecum.  Biopsies of the inflamed appearing colon showed chronic active colitis, biopsies of remaining colon were all normal.  Terminal ileum appeared normal.  (05/2014 Hep B neg, TB skin test neg)  Sed rate, CRP 08/2018 (during flare) were normal. 2. Tubular adenoma, subcentimeter colonoscopy 2015.  January 2020 also subcentimeter tubular adenoma removed, recall at 5-year interval   HPI: This is a very pleasant 48 year old man whom I last saw the time of colonoscopy a little over a month ago.  Quick tapering steroids and he has been completely off for 4 weeks. No enema in about a month.  For the past month he has had 1 bowel movement about twice daily, solid, brown, nonurgent, nonbloody.  No abdominal pains.  He takes Lialda 4 pills once daily still.    Chief complaint is left-sided ulcerative colitis ROS: complete GI ROS as described in HPI, all other review negative.  Constitutional:  No unintentional weight loss   Past Medical History:  Diagnosis Date  . Depression   . Hyperlipidemia    borderline, not on medication  . Ulcerative proctitis [facility])     Past Surgical History:  Procedure Laterality Date  . COLONOSCOPY      Current Outpatient Medications  Medication Sig Dispense Refill  . Cholecalciferol (VITAMIN D) 50 MCG (2000 UT) tablet Take 2,000 Units by mouth daily.    Marland Kitchen FLUoxetine (PROZAC) 10 MG capsule Take 10 mg by mouth daily.    Marland Kitchen loperamide (IMODIUM) 2 MG capsule Take by mouth  daily as needed for diarrhea or loose stools.    . mesalamine (LIALDA) 1.2 g EC tablet Take 4 tablets (4.8 g total) by mouth daily with breakfast. 120  tablet 3  . mesalamine (ROWASA) 4 g enema Place 60 mLs (4 g total) rectally at bedtime. (Patient taking differently: Place 4 g rectally at bedtime as needed. ) 30 Bottle 3   No current facility-administered medications for this visit.     Allergies as of 10/17/2018 - Review Complete 10/17/2018  Allergen Reaction Noted  . Imuran [azathioprine]  12/25/2013    Family History  Problem Relation Age of Onset  . Kidney cancer Mother   . Hypertension Mother   . Aneurysm Father        abdominal aortic  . Colon cancer Paternal Aunt 31  . Clotting disorder Paternal Uncle     Social History   Socioeconomic History  . Marital status: Married    Spouse name: Not on file  . Number of children: 2  . Years of education: Not on file  . Highest education level: Not on file  Occupational History  . Occupation: Therapist, sports Needs  . Financial resource strain: Not on file  . Food insecurity:    Worry: Not on file    Inability: Not on file  . Transportation needs:    Medical: Not on file    Non-medical: Not on file  Tobacco Use  . Smoking status: Never Smoker  . Smokeless tobacco: Never Used  Substance and Sexual Activity  . Alcohol use: Yes    Alcohol/week: 14.0 standard drinks    Types: 14 Cans of beer per week    Comment: 2 beers per day   . Drug use: No  . Sexual activity: Not on file  Lifestyle  . Physical activity:    Days per week: Not on file    Minutes per session: Not on file  . Stress: Not on file  Relationships  . Social connections:    Talks on phone: Not on file    Gets together: Not on file    Attends religious service: Not on file    Active member of club or organization: Not on file    Attends meetings of clubs or organizations: Not on file    Relationship status: Not on file  . Intimate partner violence:    Fear of current or ex partner: Not on file    Emotionally abused: Not on file    Physically abused: Not on file    Forced sexual activity:  Not on file  Other Topics Concern  . Not on file  Social History Narrative  . Not on file     Physical Exam: BP 140/80   Pulse 76   Ht 6' 3"  (1.905 m)   Wt 221 lb 6 oz (100.4 kg)   BMI 27.67 kg/m  Constitutional: generally well-appearing Psychiatric: alert and oriented x3 Abdomen: soft, nontender, nondistended, no obvious ascites, no peritoneal signs, normal bowel sounds No peripheral edema noted in lower extremities  Assessment and plan: 48 y.o. male with left-sided ulcerative colitis  He captured quite well with short course of prednisone, nightly mesalamine enemas and restarting oral mesalamine full dose.  Currently only on mesalamine full dose orally.  He is doing quite well clinically.  He will continue his regimen indefinitely.  He knows if he starts to have a flare symptoms again he should resume once nightly enema and call here.  Otherwise I would  like to see him in about 4 months.  Please see the "Patient Instructions" section for addition details about the plan.  Owens Loffler, MD [facility] 10/17/2018, 1:57 PM

## 2018-10-17 NOTE — Patient Instructions (Addendum)
Stay on lialda 4 pills once daily. Restart once nightly enema if you think you are having a flare and also call the office.  Please return to see Dr. Ardis Hughs in 4 months.  Thank you for entrusting me with your care and choosing [facility].  Dr Ardis Hughs

## 2019-01-31 ENCOUNTER — Other Ambulatory Visit: Payer: Self-pay | Admitting: Nurse Practitioner

## 2019-04-04 ENCOUNTER — Other Ambulatory Visit: Payer: Self-pay | Admitting: Gastroenterology

## 2019-05-08 ENCOUNTER — Ambulatory Visit: Payer: BC Managed Care – PPO | Admitting: Gastroenterology

## 2019-06-09 ENCOUNTER — Other Ambulatory Visit (INDEPENDENT_AMBULATORY_CARE_PROVIDER_SITE_OTHER): Payer: BC Managed Care – PPO

## 2019-06-09 ENCOUNTER — Encounter: Payer: Self-pay | Admitting: Gastroenterology

## 2019-06-09 ENCOUNTER — Ambulatory Visit (INDEPENDENT_AMBULATORY_CARE_PROVIDER_SITE_OTHER): Payer: BC Managed Care – PPO | Admitting: Gastroenterology

## 2019-06-09 ENCOUNTER — Other Ambulatory Visit: Payer: Self-pay

## 2019-06-09 VITALS — BP 138/88 | HR 68 | Temp 98.2°F | Ht 75.0 in | Wt 220.0 lb

## 2019-06-09 DIAGNOSIS — K5130 Ulcerative (chronic) rectosigmoiditis without complications: Secondary | ICD-10-CM | POA: Diagnosis not present

## 2019-06-09 LAB — COMPREHENSIVE METABOLIC PANEL
ALT: 21 U/L (ref 0–53)
AST: 25 U/L (ref 0–37)
Albumin: 4.2 g/dL (ref 3.5–5.2)
Alkaline Phosphatase: 75 U/L (ref 39–117)
BUN: 15 mg/dL (ref 6–23)
CO2: 26 mEq/L (ref 19–32)
Calcium: 9.5 mg/dL (ref 8.4–10.5)
Chloride: 104 mEq/L (ref 96–112)
Creatinine, Ser: 1.01 mg/dL (ref 0.40–1.50)
GFR: 78.58 mL/min (ref >60.00)
Glucose, Bld: 89 mg/dL (ref 70–99)
Potassium: 4.2 mEq/L (ref 3.5–5.1)
Sodium: 137 mEq/L (ref 135–145)
Total Bilirubin: 0.5 mg/dL (ref 0.2–1.2)
Total Protein: 7.2 g/dL (ref 6.0–8.3)

## 2019-06-09 LAB — CBC WITH DIFFERENTIAL/PLATELET
Basophils Absolute: 0.1 10*3/uL (ref 0.0–0.1)
Basophils Relative: 0.9 % (ref 0.0–3.0)
Eosinophils Absolute: 0.2 10*3/uL (ref 0.0–0.7)
Eosinophils Relative: 2.5 % (ref 0.0–5.0)
HCT: 42.7 % (ref 39.0–52.0)
Hemoglobin: 14.9 g/dL (ref 13.0–17.0)
Lymphocytes Relative: 31.3 % (ref 12.0–46.0)
Lymphs Abs: 2.2 10*3/uL (ref 0.7–4.0)
MCHC: 34.8 g/dL (ref 30.0–36.0)
MCV: 85.5 fl (ref 78.0–100.0)
Monocytes Absolute: 0.6 10*3/uL (ref 0.1–1.0)
Monocytes Relative: 8.7 % (ref 3.0–12.0)
Neutro Abs: 3.9 10*3/uL (ref 1.4–7.7)
Neutrophils Relative %: 56.6 % (ref 43.0–77.0)
Platelets: 250 10*3/uL (ref 150.0–400.0)
RBC: 5 Mil/uL (ref 4.22–5.81)
RDW: 13.2 % (ref 11.5–15.5)
WBC: 7 10*3/uL (ref 4.0–10.5)

## 2019-06-09 LAB — HIGH SENSITIVITY CRP: CRP, High Sensitivity: 12.39 mg/L — ABNORMAL HIGH (ref 0.000–5.000)

## 2019-06-09 LAB — SEDIMENTATION RATE: Sed Rate: 6 mm/hr (ref 0–15)

## 2019-06-09 MED ORDER — PREDNISONE 10 MG PO TABS: ORAL_TABLET | ORAL | 0 refills | Status: AC

## 2019-06-09 NOTE — Progress Notes (Signed)
Review of pertinent gastrointestinal problems:  1. left sided ulcerative colitis. Dr. 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 Patient has been treated over the years with oral mesalamine and intermittent mesalamine suppositories. Flared with bleeding, urgency, frequency Spring 2013. Colonoscopy April 2013 showed moderate to severe inflammation up to 25 cm. Biopsies confirmed chronic inflammation. Terminal ileum was normal. He was started on 40 mg prednisone, mesalamine enema (continued on mesalamine orally). May, 2013: Symptoms improved very quickly on prednisone. 04/2013 Flare of symptoms beginning winder 2013/14; saw extender 03/2013; c. Diff negative, started Euceris, continued oral mesalamine; 3 weeks later symptoms a bit worse and put on prednisone 40 with taper, continue oral mesalamine, added rowasa. 9/14 improving, prednisone tapering. 09/2013 flaring again. Colonoscopy 10/2013 (done to restage disease given failure to respond to steroids as he usually did, showed moderated to severe inflammation up to 25cm, normal TI. Path showed mildly active, chronic colitis, CMV stains negative. Also small TA was removed. TPTM testing level 10 (low metabolizer).12/2013 very clearly intolerant of azathiaprine (flu like severe symptoms, reliably when he took it), stopped the med and started second mesalamine trial (asacol 1600 tid). June, 2015 on asacol dosing full-strength 3 times a day his symptoms completely resolved. Symptoms improved briefly only, he was back on prednisone again. September 2015 he was initiated on Biologics, humira 10/2014 Flare after abx for pneumonia;empiric flagyl did not help, C. Diff PCR neg, GI pathogen panel neg; repeat CT showed on distal disease still; steroids restarted, rowasa restarted, eventual  improvement. 11/2014 Humira Anitbodies +++ (high titer) and no durg activity. Planned to switch to remicade however he was lost to follow up.  Several years lost to follow-up because he was feeling well, not taking any medicines.  Resurfaced late 2019 with flare-like symptoms.  Underwent colonoscopy January 2020.  Moderately inflamed colon up to 20 cm from the anus.  Also a single subcentimeter adenoma in the cecum.  Biopsies of the inflamed appearing colon showed chronic active colitis, biopsies of remaining colon were all normal.  Terminal ileum appeared normal.  (05/2014 Hep B neg, TB skin test neg)  Sed rate, CRP 08/2018 (during flare) were normal. 2. Tubular adenoma,subcentimeter colonoscopy 2015.  January 2020 also subcentimeter tubular adenoma removed, recall at 5-year interval   HPI: This is a very pleasant 48 year old man whom I last saw 7 or 8 months ago here in the office.  He at that point was doing very well after a flare of his ulcerative colitis symptoms which started around October 2019.  He responded very well to prednisone taper and then he had no problems at all with his bowels until 4 5 weeks ago, late August.  He was continued take his Lialda 4 pills once daily.  He does not take Rowasa enemas really at all.  Since August, 6 weeks ago he has had urgency, intermittent bloody diarrhea, frequency 6-10 times per day.  No abdominal pain spike.  No fevers or chills.  He cannot point to any specific reason that this flare occurred.  No NSAID overuse, no recent antibiotics.  No dramatic acute vomiting diarrheal illnesses.  No sick contacts with GI illnesses.  Chief complaint is left-sided colitis  ROS: complete GI ROS as described in HPI, all other review negative.  Constitutional:  No unintentional weight loss   Past Medical History:  Diagnosis Date  . Depression   .  Hyperlipidemia    borderline, not on medication  . Ulcerative proctitis [facility])     Past Surgical History:   Procedure Laterality Date  . COLONOSCOPY      Current Outpatient Medications  Medication Sig Dispense Refill  . Cholecalciferol (VITAMIN D) 50 MCG (2000 UT) tablet Take 2,000 Units by mouth daily.    Marland Kitchen FLUoxetine (PROZAC) 10 MG capsule Take 10 mg by mouth daily.    Marland Kitchen loperamide (IMODIUM) 2 MG capsule Take by mouth daily as needed for diarrhea or loose stools.    . mesalamine (LIALDA) 1.2 g EC tablet TAKE 4 TABLETS BY MOUTH ONCE DAILY WITH BREAKFAST 120 tablet 1  . mesalamine (ROWASA) 4 g enema Place 60 mLs (4 g total) rectally at bedtime. (Patient taking differently: Place 4 g rectally at bedtime as needed. ) 30 Bottle 3   No current facility-administered medications for this visit.     Allergies as of 06/09/2019 - Review Complete 06/09/2019  Allergen Reaction Noted  . Imuran [azathioprine]  12/25/2013    Family History  Problem Relation Age of Onset  . Kidney cancer Mother   . Hypertension Mother   . Aneurysm Father        abdominal aortic  . Colon cancer Paternal Aunt 31  . Clotting disorder Paternal Uncle     Social History   Socioeconomic History  . Marital status: Married    Spouse name: Not on file  . Number of children: 2  . Years of education: Not on file  . Highest education level: Not on file  Occupational History  . Occupation: Therapist, sports Needs  . Financial resource strain: Not on file  . Food insecurity    Worry: Not on file    Inability: Not on file  . Transportation needs    Medical: Not on file    Non-medical: Not on file  Tobacco Use  . Smoking status: Never Smoker  . Smokeless tobacco: Never Used  Substance and Sexual Activity  . Alcohol use: Yes    Alcohol/week: 14.0 standard drinks    Types: 14 Cans of beer per week    Comment: 2 beers per day   . Drug use: No  . Sexual activity: Not on file  Lifestyle  . Physical activity    Days per week: Not on file    Minutes per session: Not on file  . Stress: Not on file   Relationships  . Social Herbalist on phone: Not on file    Gets together: Not on file    Attends religious service: Not on file    Active member of club or organization: Not on file    Attends meetings of clubs or organizations: Not on file    Relationship status: Not on file  . Intimate partner violence    Fear of current or ex partner: Not on file    Emotionally abused: Not on file    Physically abused: Not on file    Forced sexual activity: Not on file  Other Topics Concern  . Not on file  Social History Narrative  . Not on file     Physical Exam: BP 138/88   Pulse 68   Temp 98.2 F (36.8 C)   Ht 6' 3"  (1.905 m)   Wt 220 lb (99.8 kg)   BMI 27.50 kg/m  Constitutional: generally well-appearing Psychiatric: alert and oriented x3 Abdomen: soft, nontender, nondistended, no obvious ascites, no peritoneal signs, normal  bowel sounds No peripheral edema noted in lower extremities  Assessment and plan: 48 y.o. male with ulcerative colitis, left-sided flare  He is likely having another flare of his distal colitis.  He had a flare around this time last year as well that responded quickly to a prednisone taper.  Going to try another prednisone taper 40 mg for 1 week 30 mg for 1 week 20 mg for 1 week etc.  In about a month from now he is going to start taking Rowasa enemas as a maintenance therapy.  He will do this 3 times weekly.  I am hoping that we can taper him off steroids and maintain him on oral Lialda 4 pills daily and Rowasa enemas 3 or 4 times weekly and that we will hold him.  If not then we briefly discussed increasing his therapy to back to Biologics, I would try Entyvio since he developed antibodies to Humira in 2016.  He will also get a basic set of labs today as well as stool studies.  He will return to see me in 6 weeks.  Please see the "Patient Instructions" section for addition details about the plan.  Owens Loffler, MD [place]  [facility] 06/09/2019, 3:53 PM

## 2019-06-09 NOTE — Patient Instructions (Addendum)
Your provider has requested that you go to the basement level for lab work before leaving today. Press "B" on the elevator. The lab is located at the first door on the left as you exit the elevator.  We have sent the following medications to your pharmacy for you to pick up at your convenience: Prednisone taper dose  Stay on Lialda 4 tablets daily  In one month restart Rowasa enemas 3 times a week  Next appointment on 07/20/19 at 3:40pm  Thank you for entrusting me with your care and choosing [facility].  Dr Ardis Hughs

## 2019-06-28 ENCOUNTER — Other Ambulatory Visit: Payer: Self-pay | Admitting: Gastroenterology

## 2019-07-20 ENCOUNTER — Encounter: Payer: Self-pay | Admitting: Gastroenterology

## 2019-07-20 ENCOUNTER — Ambulatory Visit: Payer: BC Managed Care – PPO | Admitting: Gastroenterology

## 2019-07-20 VITALS — BP 122/60 | Temp 97.6°F | Ht 75.0 in | Wt 217.0 lb

## 2019-07-20 DIAGNOSIS — K5130 Ulcerative (chronic) rectosigmoiditis without complications: Secondary | ICD-10-CM

## 2019-07-20 MED ORDER — MESALAMINE 1.2 G PO TBEC: DELAYED_RELEASE_TABLET | ORAL | 11 refills | Status: DC

## 2019-07-20 MED ORDER — MESALAMINE 4 G RE ENEM: 4.0000 g | ENEMA | RECTAL | 11 refills | Status: DC

## 2019-07-20 NOTE — Progress Notes (Signed)
Review of pertinent gastrointestinal problems:  1. left sided ulcerative colitis. Dr. 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 Patient has been treated over the years with oral mesalamine and intermittent mesalamine suppositories. Flared with bleeding, urgency, frequency Spring 2013. Colonoscopy April 2013 showed moderate to severe inflammation up to 25 cm. Biopsies confirmed chronic inflammation. Terminal ileum was normal. He was started on 40 mg prednisone, mesalamine enema (continued on mesalamine orally). May, 2013: Symptoms improved very quickly on prednisone. 04/2013 Flare of symptoms beginning winder 2013/14; saw extender 03/2013; c. Diff negative, started Euceris, continued oral mesalamine; 3 weeks later symptoms a bit worse and put on prednisone 40 with taper, continue oral mesalamine, added rowasa. 9/14 improving, prednisone tapering. 09/2013 flaring again. Colonoscopy 10/2013 (done to restage disease given failure to respond to steroids as he usually did, showed moderated to severe inflammation up to 25cm, normal TI. Path showed mildly active, chronic colitis, CMV stains negative. Also small TA was removed. TPTM testing level 10 (low metabolizer).12/2013 very clearly intolerant of azathiaprine (flu like severe symptoms, reliably when he took it), stopped the med and started second mesalamine trial (asacol 1600 tid). June, 2015 on asacol dosing full-strength 3 times a day his symptoms completely resolved. Symptoms improved briefly only, he was back on prednisone again. September 2015 he was initiated on Biologics, humira2/2016 Flare after abx for pneumonia;empiric flagyl did not help, C. Diff PCR neg, GI pathogen panel neg; repeat CT showed on distal disease still; steroids restarted, rowasa restarted, eventual  improvement. 11/2014 Humira Anitbodies +++ (high titer) and no durg activity. Planned to switch to remicade however he was lost to follow up.Several years lost to follow-up because he was feeling well, not taking any medicines. Resurfaced late 2019 with flare-like symptoms. Underwent colonoscopy January 2020.Moderately inflamed colon up to 20 cm from the anus. Also a single subcentimeter adenomain the cecum. Biopsies of the inflamed appearing colon showed chronic active colitis, biopsies of remaining colon were all normal. Terminal ileum appeared normal.  (05/2014 Hep B neg, TB skin test neg)  Sed rate, CRP 08/2018 (during flare) were normal. 2. Tubular adenoma,subcentimeter colonoscopy 2015.January 2020 also subcentimeter tubular adenoma removed, recall at 5-year interval   HPI: This is a very pleasant 48 year old man  I last saw him a little over a month ago.  He seemed to be having another flare of his ulcerative colitis.  CRP was elevated, sed rate and CBC, complete metabolic profile were all normal.  I had him start a prednisone taper 40 mg tapering by 10 mg/week and he was to continue taking his Lialda 4 pills daily.  He was to start taking Rowasa enemas after about a month of the steroids.  He felt quickly back to normal within about 5 days of prednisone.  He completed his prednisone 40 mg taper by 10 mg every week.  He has been off of steroids now for about 10 days or so.  His bowels are completely back to normal.  No urgency, no bleeding, no frequency.    Chief complaint is left-sided colitis  ROS: complete GI ROS as described in HPI, all other review negative.  Constitutional:  No unintentional weight loss   Past Medical History:  Diagnosis Date  . Depression   . Hyperlipidemia    borderline, not on medication  . Ulcerative proctitis [facility])     Past Surgical History:  Procedure Laterality Date  . COLONOSCOPY      Current Outpatient Medications  Medication Sig  Dispense Refill  . Cholecalciferol (VITAMIN D) 50 MCG (2000 UT) tablet Take 2,000 Units by mouth daily.    Marland Kitchen FLUoxetine (PROZAC) 10 MG capsule Take 10 mg by mouth daily.    Marland Kitchen loperamide (IMODIUM) 2 MG capsule Take by mouth daily as needed for diarrhea or loose stools.    . mesalamine (LIALDA) 1.2 g EC tablet TAKE 4 TABLETS BY MOUTH ONCE DAILY WITH BREAKFAST 120 tablet 0  . mesalamine (ROWASA) 4 g enema Place 60 mLs (4 g total) rectally at bedtime. (Patient taking differently: Place 4 g rectally at bedtime as needed. ) 30 Bottle 3   No current facility-administered medications for this visit.     Allergies as of 07/20/2019 - Review Complete 07/20/2019  Allergen Reaction Noted  . Imuran [azathioprine]  12/25/2013    Family History  Problem Relation Age of Onset  . Kidney cancer Mother   . Hypertension Mother   . Aneurysm Father        abdominal aortic  . Colon cancer Paternal Aunt 5  . Clotting disorder Paternal Uncle     Social History   Socioeconomic History  . Marital status: Married    Spouse name: Not on file  . Number of children: 2  . Years of education: Not on file  . Highest education level: Not on file  Occupational History  . Occupation: Therapist, sports Needs  . Financial resource strain: Not on file  . Food insecurity    Worry: Not on file    Inability: Not on file  . Transportation needs    Medical: Not on file    Non-medical: Not on file  Tobacco Use  . Smoking status: Never Smoker  . Smokeless tobacco: Never Used  Substance and Sexual Activity  . Alcohol use: Yes    Alcohol/week: 14.0 standard drinks    Types: 14 Cans of beer per week    Comment: 2 beers per day   . Drug use: No  . Sexual activity: Not on file  Lifestyle  . Physical activity    Days per week: Not on file    Minutes per session: Not on file  . Stress: Not on file  Relationships  . Social Herbalist on phone: Not on file    Gets together: Not on file     Attends religious service: Not on file    Active member of club or organization: Not on file    Attends meetings of clubs or organizations: Not on file    Relationship status: Not on file  . Intimate partner violence    Fear of current or ex partner: Not on file    Emotionally abused: Not on file    Physically abused: Not on file    Forced sexual activity: Not on file  Other Topics Concern  . Not on file  Social History Narrative  . Not on file     Physical Exam: BP 122/60   Temp 97.6 F (36.4 C)   Ht 6' 3"  (1.905 m)   Wt 217 lb (98.4 kg)   BMI 27.12 kg/m  Constitutional: generally well-appearing Psychiatric: alert and oriented x3 Abdomen: soft, nontender, nondistended, no obvious ascites, no peritoneal signs, normal bowel sounds No peripheral edema noted in lower extremities  Assessment and plan: 48 y.o. male with limited, left-sided ulcerative colitis  He has a  very low volume of disease however he has required steroids this past month and also about 1 year ago.  I explained to him that I do not think that is an acceptable amount of steroid usage and so I would like to increase his daily therapy.  He is currently on Lialda 4.8 g once daily.  I think topical steroids even just every other day or 3 times a week may be enough to keep him from flaring annually.  Prescribing Rowasa enemas he knows to take 1 enema every other day.  If he still has a clear flare within the next several months or a year then I think we need to increase his coverage to Biologics again, likely Entyvio.  He will return to see me in 6 months and sooner if any problems.  Please see the "Patient Instructions" section for addition details about the plan.  Owens Loffler, MD [facility] 07/20/2019, 3:56 PM

## 2019-07-20 NOTE — Patient Instructions (Signed)
If you are age 49 or older, your body mass index should be between 23-30. Your Body mass index is 27.12 kg/m. If this is out of the aforementioned range listed, please consider follow up with your Primary Care Provider.  If you are age 36 or younger, your body mass index should be between 19-25. Your Body mass index is 27.12 kg/m. If this is out of the aformentioned range listed, please consider follow up with your Primary Care Provider.   We have sent the following medications to your pharmacy for you to pick up at your convenience:  1. Continue Lialda - 4 tablets daily.  2. Rowasa 4g enema every other night.   Follow up in 6 months

## 2020-05-02 ENCOUNTER — Encounter: Payer: Self-pay | Admitting: Gastroenterology

## 2020-05-02 ENCOUNTER — Ambulatory Visit: Payer: BC Managed Care – PPO | Admitting: Gastroenterology

## 2020-05-02 VITALS — BP 130/82 | HR 69 | Ht 75.0 in | Wt 214.2 lb

## 2020-05-02 DIAGNOSIS — K5130 Ulcerative (chronic) rectosigmoiditis without complications: Secondary | ICD-10-CM

## 2020-05-02 NOTE — Progress Notes (Signed)
Review of pertinent gastrointestinal problems:  1. left sided ulcerative colitis. Dr. 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 Patient has been treated over the years with oral mesalamine and intermittent mesalamine suppositories. Flared with bleeding, urgency, frequency Spring 2013. Colonoscopy April 2013 showed moderate to severe inflammation up to 25 cm. Biopsies confirmed chronic inflammation. Terminal ileum was normal. He was started on 40 mg prednisone, mesalamine enema (continued on mesalamine orally). May, 2013: Symptoms improved very quickly on prednisone. 04/2013 Flare of symptoms beginning winder 2013/14; saw extender 03/2013; c. Diff negative, started Euceris, continued oral mesalamine; 3 weeks later symptoms a bit worse and put on prednisone 40 with taper, continue oral mesalamine, added rowasa. 9/14 improving, prednisone tapering. 09/2013 flaring again. Colonoscopy 10/2013 (done to restage disease given failure to respond to steroids as he usually did, showed moderated to severe inflammation up to 25cm, normal TI. Path showed mildly active, chronic colitis, CMV stains negative. Also small TA was removed. TPTM testing level 10 (low metabolizer).12/2013 very clearly intolerant of azathiaprine (flu like severe symptoms, reliably when he took it), stopped the med and started second mesalamine trial (asacol 1600 tid). June, 2015 on asacol dosing full-strength 3 times a day his symptoms completely resolved. Symptoms improved briefly only, he was back on prednisone again. September 2015 he was initiated on Biologics, humira2/2016 Flare after abx for pneumonia;empiric flagyl did not help, C. Diff PCR neg, GI pathogen panel neg; repeat CT showed on distal disease still; steroids restarted, rowasa restarted, eventual  improvement.11/2014 Humira Anitbodies +++ (high titer) and no durg activity. Planned to switch to remicade however he was lost to follow up.Several years lost to follow-up because he was feeling well, not taking any medicines. Resurfaced late 2019 with flare-like symptoms. Underwent colonoscopy January 2020.Moderately inflamed colon up to 20 cm from the anus. Also a single subcentimeter adenomain the cecum. Biopsies of the inflamed appearing colon showed chronic active colitis, biopsies of remaining colon were all normal. Terminal ileum appeared normal.  (05/2014 Hep B neg, TB skin test neg)  Sed rate, CRP 08/2018 (during flare) were normal. 2. Tubular adenoma,subcentimeter colonoscopy 2015.January 2020 also subcentimeter tubular adenoma removed, recall at 5-year interval   HPI: This is a very pleasant 49 year old man   I last saw Daniel Bridges about 9 months ago.  At that time he had already completed his quick prednisone taper for flare-like symptoms.  We had a nice discussion about his ulcerative colitis, and he understands my goal is steroid free treatment long-term.  He was going to stay on his Lialda 4.8 g once daily and also take Rowasa enemas every other day to every third day and lieu of increasing to Biologics.  His weight is down 3 pounds since his last office visit here, same scale  After his November 2020 visit he stayed on Rowasa enemas for 2 or 3 weeks and then has tapered completely off.  Since then he has been on Lialda 4 pills once daily and he is doing very well.  He has no bleeding, no urgency, no abdominal pains.  He moves his bowels 2-3 times a day, solid brown.  He does admit that he generally has a flare in the fall and so he is a bit nervous at this point.  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 [place]  ROS: complete GI ROS as described in HPI, all other review negative.  Constitutional:  No  unintentional weight loss   Past Medical History:  Diagnosis Date  . Depression   . Hyperlipidemia    borderline, not on medication  . Ulcerative proctitis [facility])     Past Surgical History:  Procedure Laterality Date  . COLONOSCOPY      Current Outpatient Medications  Medication Sig Dispense Refill  . Cholecalciferol (VITAMIN D) 50 MCG (2000 UT) tablet Take 2,000 Units by mouth daily.    Marland Kitchen FLUoxetine (PROZAC) 10 MG capsule Take 10 mg by mouth daily.    Marland Kitchen latanoprost (XALATAN) 0.005 % ophthalmic solution 1 drop at bedtime.    Marland Kitchen lisinopril-hydrochlorothiazide (ZESTORETIC) 10-12.5 MG tablet Take 1 tablet by mouth daily.    Marland Kitchen loperamide (IMODIUM) 2 MG capsule Take by mouth daily as needed for diarrhea or loose stools.    . mesalamine (LIALDA) 1.2 g EC tablet TAKE 4 TABLETS BY MOUTH ONCE DAILY WITH BREAKFAST 120 tablet 11  . mesalamine (ROWASA) 4 g enema Place 60 mLs (4 g total) rectally every other day. 15 enema 11   No current facility-administered medications for this visit.    Allergies as of 05/02/2020 - Review Complete 05/02/2020  Allergen Reaction Noted  . Imuran [azathioprine]  12/25/2013    Family History  Problem Relation Age of Onset  . Kidney cancer Mother   . Hypertension Mother   . Aneurysm Father        abdominal aortic  . Colon cancer Paternal Aunt 43  . Clotting disorder Paternal Uncle     Social History   Socioeconomic History  . Marital status: Married    Spouse name: Not on file  . Number of children: 2  . Years of education: Not on file  . Highest education level: Not on file  Occupational History  . Occupation: Media planner  Tobacco Use  . Smoking status: Never Smoker  . Smokeless tobacco: Never Used  Vaping Use  . Vaping Use: Never used  Substance and Sexual Activity  . Alcohol use: Yes    Alcohol/week: 14.0 standard drinks    Types: 14 Cans of beer per week    Comment: 2 beers per day   . Drug use: No  . Sexual activity: Not on  file  Other Topics Concern  . Not on file  Social History Narrative  . Not on file   Social Determinants of Health   Financial Resource Strain:   . Difficulty of Paying Living Expenses: Not on file  Food Insecurity:   . Worried About Charity fundraiser in the Last Year: Not on file  . Ran Out of Food in the Last Year: Not on file  Transportation Needs:   . Lack of Transportation (Medical): Not on file  . Lack of Transportation (Non-Medical): Not on file  Physical Activity:   . Days of Exercise per Week: Not on file  . Minutes of Exercise per Session: Not on file  Stress:   . Feeling of Stress : Not on file  Social Connections:   . Frequency of Communication with Friends and Family: Not on file  . Frequency of Social Gatherings with Friends and Family: Not on file  . Attends Religious Services: Not on file  . Active Member of Clubs or Organizations: Not on file  . Attends Archivist Meetings: Not on file  . Marital Status: Not on file  Intimate Partner Violence:   . Fear of Current or Ex-Partner: Not on file  . Emotionally Abused: Not on file  . Physically Abused: Not on file  . Sexually Abused: Not on file     Physical Exam: BP 130/82   Pulse 69   Ht 6' 3"  (1.905 m)   Wt 214 lb 4 oz (97.2 kg)   BMI 26.78 kg/m  Constitutional: generally well-appearing Psychiatric: alert and oriented x3 Abdomen: soft, nontender, nondistended, no obvious ascites, no peritoneal signs, normal bowel sounds No peripheral edema noted in lower extremities  Assessment and plan: 49 y.o. male with limited ulcerative colitis  He is doing well on mesalamine orally with as needed topical mesalamine by Rowasa enema.  He will continue this regimen and will return to see me in 1 year, sooner if any troubles.  Please see the "Patient Instructions" section for addition details about the plan.  Owens Loffler, MD [facility] 05/02/2020, 8:51 AM   Total time on date of  encounter was 25 minutes (this included time spent preparing to see the patient reviewing records; obtaining and/or reviewing separately obtained history; performing a medically appropriate exam and/or evaluation; counseling and educating the patient and family if present; ordering medications, tests or procedures if applicable; and documenting clinical information in the health record).

## 2020-05-02 NOTE — Patient Instructions (Signed)
If you are age 50 or older, your body mass index should be between 23-30. Your Body mass index is 26.78 kg/m. If this is out of the aforementioned range listed, please consider follow up with your Primary Care Provider.  If you are age 65 or younger, your body mass index should be between 19-25. Your Body mass index is 26.78 kg/m. If this is out of the aformentioned range listed, please consider follow up with your Primary Care Provider.   You will follow up with our office in 1 year (August 2022).  Thank you for entrusting me with your care and choosing [facility].  Dr Ardis Hughs

## 2020-07-30 ENCOUNTER — Other Ambulatory Visit: Payer: Self-pay | Admitting: Gastroenterology

## 2021-06-07 ENCOUNTER — Encounter: Payer: Self-pay | Admitting: Gastroenterology

## 2021-06-07 ENCOUNTER — Ambulatory Visit: Payer: BC Managed Care – PPO | Admitting: Gastroenterology

## 2021-06-07 VITALS — BP 136/88 | HR 60 | Ht 75.0 in | Wt 216.0 lb

## 2021-06-07 DIAGNOSIS — K5130 Ulcerative (chronic) rectosigmoiditis without complications: Secondary | ICD-10-CM | POA: Diagnosis not present

## 2021-06-07 NOTE — Patient Instructions (Addendum)
If you are age 51 or younger, your body mass index should be between 19-25. Your Body mass index is 27 kg/m. If this is out of the aformentioned range listed, please consider follow up with your Primary Care Provider.  _________________________________________________________  The [facility] providers would like to encourage you to use [facility] to communicate with providers for non-urgent requests or questions.  Due to long hold times on the telephone, sending your provider a message by [facility] may be a faster and more efficient way to get a response.  Please allow 48 business hours for a response.  Please remember that this is for non-urgent requests.   You will follow up with our office in 1 year (September 2023).  We will contact you to schedule this appointment.  CONTINUE: Lialda 1.2 g 4 tablets daily.  Thank you for entrusting me with your care and choosing [facility].  Dr Ardis Hughs

## 2021-06-07 NOTE — Progress Notes (Signed)
Review of pertinent gastrointestinal problems:   1. left sided ulcerative colitis. Dr. 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 Patient has been treated over the years with oral mesalamine and intermittent mesalamine suppositories. Flared with bleeding, urgency, frequency Spring 2013. Colonoscopy April 2013 showed moderate to severe inflammation up to 25 cm. Biopsies confirmed chronic inflammation. Terminal ileum was normal. He was started on 40 mg prednisone, mesalamine enema (continued on mesalamine orally). May, 2013: Symptoms improved very quickly on prednisone. 04/2013 Flare of symptoms beginning winder 2013/14; saw extender 03/2013; c. Diff negative, started Euceris, continued oral mesalamine; 3 weeks later symptoms a bit worse and put on prednisone 40 with taper, continue oral mesalamine, added rowasa. 9/14 improving, prednisone tapering. 09/2013 flaring again. Colonoscopy 10/2013 (done to restage disease given failure to respond to steroids as he usually did, showed moderated to severe inflammation up to 25cm, normal TI. Path showed mildly active, chronic colitis, CMV stains negative. Also small TA was removed. TPTM testing level 10 (low metabolizer). 12/2013 very clearly intolerant of azathiaprine (flu like severe symptoms, reliably when he took it), stopped the med and started second mesalamine trial (asacol 1600 tid). June, 2015 on asacol dosing full-strength 3 times a day his symptoms completely resolved. Symptoms improved briefly only, he was back on prednisone again. September 2015 he was initiated on Biologics, humira 10/2014 Flare after abx for pneumonia; empiric flagyl did not help, C. Diff PCR neg, GI pathogen panel neg; repeat CT showed on distal disease still; steroids restarted, rowasa restarted, eventual  improvement. 11/2014 Humira Anitbodies +++ (high titer) and no durg activity. Planned to switch to remicade however he was lost to follow up.  Several years lost to follow-up because he was feeling well, not taking any medicines.  Resurfaced late 2019 with flare-like symptoms.  Underwent colonoscopy January 2020.  Moderately inflamed colon up to 20 cm from the anus.  Also a single subcentimeter adenoma in the cecum.  Biopsies of the inflamed appearing colon showed chronic active colitis, biopsies of remaining colon were all normal.  Terminal ileum appeared normal. (05/2014 Hep B neg, TB skin test neg) Sed rate, CRP 08/2018 (during flare) were normal. 2. Tubular adenoma, subcentimeter colonoscopy 2015.  January 2020 also subcentimeter tubular adenoma removed, recall at 5-year interval   HPI: This is a very pleasant 50 year old man  I Last saw Daniel Bridges about 1 year ago.  At that time he was doing well on full-strength mesalamine (lialda 4.8gm daily) orally as well as topical mesalamine by a Rowasa enema given on appearing basis.  His weight is up 2 pounds since his last office visit here 1 year ago, same scale.  He has done very well in the past year.  He continues to take his Lialda 4 pills once daily.  He has had no flare type of events.  He has had on 2 occasions very minor rectal bleeding on the toilet paper but this was not associated with urgency, frequency or abdominal pains.  His weight is overall stable.  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  Constitutional:  No unintentional weight loss   Past Medical History:  Diagnosis Date   Depression    Hyperlipidemia    borderline, not on medication   Ulcerative proctitis (  [place])     Past Surgical History:  Procedure Laterality Date   COLONOSCOPY      Current Outpatient Medications  Medication Sig Dispense Refill   atorvastatin (LIPITOR) 10 MG  tablet Take 10 mg by mouth daily.     Cholecalciferol (VITAMIN D) 50 MCG (2000 UT) tablet Take 2,000 Units by mouth daily.     FLUoxetine (PROZAC) 10 MG capsule Take 10 mg by mouth daily.     latanoprost (XALATAN) 0.005 % ophthalmic solution 1 drop at bedtime.     lisinopril-hydrochlorothiazide (ZESTORETIC) 10-12.5 MG tablet Take 1 tablet by mouth daily.     loperamide (IMODIUM) 2 MG capsule Take by mouth daily as needed for diarrhea or loose stools.     mesalamine (LIALDA) 1.2 g EC tablet TAKE 4 TABLETS BY MOUTH ONCE DAILY WITH BREAKFAST 120 tablet 9   mesalamine (ROWASA) 4 g enema Place 60 mLs (4 g total) rectally every other day. 15 enema 11   No current facility-administered medications for this visit.    Allergies as of 06/07/2021 - Review Complete 06/07/2021  Allergen Reaction Noted   Imuran [azathioprine]  12/25/2013    Family History  Problem Relation Age of Onset   Kidney cancer Mother    Hypertension Mother    Aneurysm Father        abdominal aortic   Colon cancer Paternal Aunt 60   Clotting disorder Paternal Uncle     Social History   Socioeconomic History   Marital status: Married    Spouse name: Not on file   Number of children: 2   Years of education: Not on file   Highest education level: Not on file  Occupational History   Occupation: Media planner  Tobacco Use   Smoking status: Never   Smokeless tobacco: Never  Vaping Use   Vaping Use: Never used  Substance and Sexual Activity   Alcohol use: Yes    Alcohol/week: 14.0 standard drinks    Types: 14 Cans of beer per week    Comment: 2 beers per day    Drug use: No   Sexual activity: Not on file  Other Topics Concern   Not on file  Social History Narrative   Not on file   Social Determinants of Health   Financial Resource Strain: Not on file  Food Insecurity: Not on file  Transportation Needs: Not on file  Physical Activity: Not on file  Stress: Not on file  Social Connections: Not on file   Intimate Partner Violence: Not on file     Physical Exam: BP 136/88   Pulse 60   Ht 6' 3"  (1.905 m)   Wt 216 lb (98 kg)   BMI 27.00 kg/m  Constitutional: generally well-appearing Psychiatric: alert and oriented x3 Abdomen: soft, nontender, nondistended, no obvious ascites, no peritoneal signs, normal bowel sounds No peripheral edema noted in lower extremities  Assessment and plan: 50 y.o. male with left-sided ulcerative colitis  He is doing very well for the past year or 2 on Lialda 4 pills once daily.  He has needed no rescue Rowasa enemas for at least a year now.  He knows to continue his Lialda 4 pills once daily.  If he believes he is having another flare he knows to start back up on the Rowasa enema and call here for further guidance.  He will return to see me in 1 year and sooner if needed.  Please see the "Patient Instructions" section for addition details about  the plan.  Owens Loffler, MD [facility] 06/07/2021, 11:25 AM   Total time on date of encounter was 30 minutes (this included time spent preparing to see the patient reviewing records; obtaining and/or reviewing separately obtained history; performing a medically appropriate exam and/or evaluation; counseling and educating the patient and family if present; ordering medications, tests or procedures if applicable; and documenting clinical information in the health record).

## 2021-07-03 MED ORDER — MESALAMINE 4 G RE ENEM: 4.0000 g | ENEMA | Freq: Every day | RECTAL | 3 refills | Status: DC

## 2021-07-05 ENCOUNTER — Other Ambulatory Visit: Payer: Self-pay

## 2021-07-05 MED ORDER — MESALAMINE 1.2 G PO TBEC
4.8000 g | DELAYED_RELEASE_TABLET | Freq: Every day | ORAL | 11 refills | Status: DC
Start: 2021-07-05 — End: 2022-07-26

## 2022-04-06 ENCOUNTER — Telehealth: Payer: Self-pay

## 2022-04-06 NOTE — Telephone Encounter (Signed)
Patient needs 1 yr follow up in Sept 2023 dx UC with Dr Ardis Hughs.   Left message for patient to return call to schedule follow up.  Will continue efforts.

## 2022-04-10 NOTE — Telephone Encounter (Signed)
Patient aware that he has been scheduled for 1 year follow up with Dr Ardis Hughs on 06-12-22 at 8:50am.  Patient agreed to plan and verbalized understanding.  No further questions.

## 2022-06-13 ENCOUNTER — Ambulatory Visit: Payer: BC Managed Care – PPO | Admitting: Gastroenterology

## 2022-07-26 ENCOUNTER — Ambulatory Visit: Payer: BC Managed Care – PPO | Admitting: Gastroenterology

## 2022-07-26 ENCOUNTER — Other Ambulatory Visit (INDEPENDENT_AMBULATORY_CARE_PROVIDER_SITE_OTHER): Payer: BC Managed Care – PPO

## 2022-07-26 ENCOUNTER — Encounter: Payer: Self-pay | Admitting: Gastroenterology

## 2022-07-26 VITALS — BP 136/86 | HR 64 | Ht 75.0 in | Wt 224.0 lb

## 2022-07-26 DIAGNOSIS — K5130 Ulcerative (chronic) rectosigmoiditis without complications: Secondary | ICD-10-CM

## 2022-07-26 LAB — CBC WITH DIFFERENTIAL/PLATELET
Basophils Absolute: 0.1 10*3/uL (ref 0.0–0.1)
Basophils Relative: 1.3 % (ref 0.0–3.0)
Eosinophils Absolute: 0.1 10*3/uL (ref 0.0–0.7)
Eosinophils Relative: 2.6 % (ref 0.0–5.0)
HCT: 45.5 % (ref 39.0–52.0)
Hemoglobin: 16.1 g/dL (ref 13.0–17.0)
Lymphocytes Relative: 35.7 % (ref 12.0–46.0)
Lymphs Abs: 2 10*3/uL (ref 0.7–4.0)
MCHC: 35.3 g/dL (ref 30.0–36.0)
MCV: 86.1 fl (ref 78.0–100.0)
Monocytes Absolute: 0.5 10*3/uL (ref 0.1–1.0)
Monocytes Relative: 8.2 % (ref 3.0–12.0)
Neutro Abs: 2.8 10*3/uL (ref 1.4–7.7)
Neutrophils Relative %: 52.2 % (ref 43.0–77.0)
Platelets: 259 10*3/uL (ref 150.0–400.0)
RBC: 5.28 Mil/uL (ref 4.22–5.81)
RDW: 13.2 % (ref 11.5–15.5)
WBC: 5.5 10*3/uL (ref 4.0–10.5)

## 2022-07-26 LAB — COMPREHENSIVE METABOLIC PANEL
ALT: 46 U/L (ref 0–53)
AST: 40 U/L — ABNORMAL HIGH (ref 0–37)
Albumin: 4.5 g/dL (ref 3.5–5.2)
Alkaline Phosphatase: 67 U/L (ref 39–117)
BUN: 15 mg/dL (ref 6–23)
CO2: 26 mEq/L (ref 19–32)
Calcium: 9.5 mg/dL (ref 8.4–10.5)
Chloride: 104 mEq/L (ref 96–112)
Creatinine, Ser: 0.91 mg/dL (ref 0.40–1.50)
GFR: 97.31 mL/min (ref >60.00)
Glucose, Bld: 78 mg/dL (ref 70–99)
Potassium: 4.1 mEq/L (ref 3.5–5.1)
Sodium: 138 mEq/L (ref 135–145)
Total Bilirubin: 0.9 mg/dL (ref 0.2–1.2)
Total Protein: 7.4 g/dL (ref 6.0–8.3)

## 2022-07-26 MED ORDER — MESALAMINE 1.2 G PO TBEC: 4.8000 g | DELAYED_RELEASE_TABLET | Freq: Every day | ORAL | 3 refills | Status: DC

## 2022-07-26 NOTE — Patient Instructions (Signed)
_______________________________________________________  If you are age 52 or older, your body mass index should be between 23-30. Your Body mass index is 28 kg/m. If this is out of the aforementioned range listed, please consider follow up with your Primary Care Provider.  If you are age 50 or younger, your body mass index should be between 19-25. Your Body mass index is 28 kg/m. If this is out of the aformentioned range listed, please consider follow up with your Primary Care Provider.   ________________________________________________________  The [facility] providers would like to encourage you to use [facility] to communicate with providers for non-urgent requests or questions.  Due to long hold times on the telephone, sending your provider a message by [facility] may be a faster and more efficient way to get a response.  Please allow 48 business hours for a response.  Please remember that this is for non-urgent requests.  _______________________________________________________  Your provider has requested that you go to the basement level for lab work before leaving today. Press "B" on the elevator. The lab is located at the first door on the left as you exit the elevator.  We have sent the following medications to your pharmacy for you to pick up at your convenience:  Lialda

## 2022-07-26 NOTE — Progress Notes (Signed)
07/26/2022 Daniel Bridges [id number] 1970-09-21  Review of pertinent gastrointestinal problems:   1. left sided ulcerative colitis. Dr. 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 Patient has been treated over the years with oral mesalamine and intermittent mesalamine suppositories. Flared with bleeding, urgency, frequency Spring 2013. Colonoscopy April 2013 showed moderate to severe inflammation up to 25 cm. Biopsies confirmed chronic inflammation. Terminal ileum was normal. He was started on 40 mg prednisone, mesalamine enema (continued on mesalamine orally). May, 2013: Symptoms improved very quickly on prednisone. 04/2013 Flare of symptoms beginning winder 2013/14; saw extender 03/2013; c. Diff negative, started Euceris, continued oral mesalamine; 3 weeks later symptoms a bit worse and put on prednisone 40 with taper, continue oral mesalamine, added rowasa. 9/14 improving, prednisone tapering. 09/2013 flaring again. Colonoscopy 10/2013 (done to restage disease given failure to respond to steroids as he usually did, showed moderated to severe inflammation up to 25cm, normal TI. Path showed mildly active, chronic colitis, CMV stains negative. Also small TA was removed. TPTM testing level 10 (low metabolizer). 12/2013 very clearly intolerant of azathiaprine (flu like severe symptoms, reliably when he took it), stopped the med and started second mesalamine trial (asacol 1600 tid). June, 2015 on asacol dosing full-strength 3 times a day his symptoms completely resolved. Symptoms improved briefly only, he was back on prednisone again. September 2015 he was initiated on Biologics, humira 10/2014 Flare after abx for pneumonia; empiric flagyl did not help, C. Diff PCR neg, GI pathogen panel neg; repeat CT showed on  distal disease still; steroids restarted, rowasa restarted, eventual improvement. 11/2014 Humira Anitbodies +++ (high titer) and no durg activity. Planned to switch to remicade however he was lost to follow up.  Several years lost to follow-up because he was feeling well, not taking any medicines.  Resurfaced late 2019 with flare-like symptoms.  Underwent colonoscopy January 2020.  Moderately inflamed colon up to 20 cm from the anus.  Also a single subcentimeter adenoma in the cecum.  Biopsies of the inflamed appearing colon showed chronic active colitis, biopsies of remaining colon were all normal.  Terminal ileum appeared normal. (05/2014 Hep B neg, TB skin test neg) Sed rate, CRP 08/2018 (during flare) were normal. 2. Tubular adenoma, subcentimeter colonoscopy 2015.  January 2020 also subcentimeter tubular adenoma removed, recall at 5-year interval  HISTORY OF PRESENT ILLNESS:  This is a 51 year old male who is a patient of Dr. Ardis Hughs.  He follows here for his left sided ulcerative colitis.  He takes Lialda 4.8 grams daily and needs that refilled.  Last visit here was 05/2021.  Has been doing well for the past couple of years.  Has Rowasa enemas on hand if needs to use those, but he has not needed them.  Has about 4 BMs a day but they are normal, formed stools, without blood.  No abdominal pain.  Has not had any labs performed in 3 years.   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 He has never used smokeless tobacco. He reports current alcohol use of about 14.0 standard drinks of alcohol per week. He reports  that he does not use drugs. family history includes Aneurysm in his father; Clotting disorder in his paternal uncle; Colon cancer (age of onset: 32) in his paternal aunt; Hypertension in his mother;  Kidney cancer in his mother. Allergies  Allergen Reactions   Imuran [Azathioprine]     Vomiting after taking it, reliably improves off the med and recurs even with a single dose      Outpatient Encounter Medications as of 07/26/2022  Medication Sig   atorvastatin (LIPITOR) 10 MG tablet Take 10 mg by mouth daily.   Cholecalciferol (VITAMIN D) 50 MCG (2000 UT) tablet Take 2,000 Units by mouth daily.   Fluoxetine HCl, PMDD, 20 MG TABS Take 1 tablet by mouth every morning.   latanoprost (XALATAN) 0.005 % ophthalmic solution 1 drop at bedtime.   lisinopril-hydrochlorothiazide (ZESTORETIC) 10-12.5 MG tablet Take 1 tablet by mouth daily.   mesalamine (LIALDA) 1.2 g EC tablet Take 4 tablets (4.8 g total) by mouth daily with breakfast.   mesalamine (ROWASA) 4 g enema Place 60 mLs (4 g total) rectally every other day. (Patient taking differently: Place 4 g rectally as needed.)   [DISCONTINUED] FLUoxetine (PROZAC) 10 MG capsule Take 10 mg by mouth daily.   [DISCONTINUED] loperamide (IMODIUM) 2 MG capsule Take by mouth daily as needed for diarrhea or loose stools.   [DISCONTINUED] mesalamine (ROWASA) 4 g enema Place 60 mLs (4 g total) rectally at bedtime.   No facility-administered encounter medications on file as of 07/26/2022.     REVIEW OF SYSTEMS  : All other systems reviewed and negative except where noted in the History of Present Illness.   PHYSICAL EXAM: BP 136/86   Pulse 64   Ht 6' 3"  (1.905 m)   Wt 224 lb (101.6 kg)   SpO2 98%   BMI 28.00 kg/m  General: Well developed white male in no acute distress Head: Normocephalic and atraumatic Eyes:  Sclerae anicteric, conjunctiva pink. Ears: Normal auditory acuity Lungs: Clear throughout to auscultation; no W/R/R. Heart: Regular rate and rhythm; no M/R/G. Abdomen: Soft, non-distended.  BS present. Non-tender. Musculoskeletal: Symmetrical with no gross deformities  Skin: No lesions on visible extremities Extremities: No edema   Neurological: Alert oriented x 4, grossly non-focal Psychological:  Alert and cooperative. Normal mood and affect  ASSESSMENT AND PLAN: *51 year old male with left-sided ulcerative colitis: Doing very well for the past few years on Lialda 4 pills once daily.  He has Rowasa enemas on hand, but has not needed them in quite some time.  Needs refills on his Lialda.  We will send enough for 90-day supply and refills for a year.  We will check a CBC and CMP today as he has not had any labs done in 3 years.  Follow-up in about a year or sooner if needed.   CC:  Orpah Melter, MD

## 2022-09-08 NOTE — Progress Notes (Signed)
Reviewed and agree with management plans. Needs colonoscopy for colon cancer screening sometime this year given his history of UC.  Daniel Henshaw L. Tarri Glenn, MD, MPH

## 2023-09-24 ENCOUNTER — Ambulatory Visit
Admission: RE | Admit: 2023-09-24 | Discharge: 2023-09-24 | Disposition: A | Discharge status: Home / Self Care | Payer: 59 | Source: Ambulatory Visit | Attending: Family Medicine | Admitting: Family Medicine

## 2023-09-24 ENCOUNTER — Other Ambulatory Visit: Payer: Self-pay

## 2023-09-24 ENCOUNTER — Ambulatory Visit (INDEPENDENT_AMBULATORY_CARE_PROVIDER_SITE_OTHER): Payer: 59

## 2023-09-24 VITALS — BP 142/93 | HR 90 | Temp 98.4°F | Resp 17

## 2023-09-24 DIAGNOSIS — R051 Acute cough: Secondary | ICD-10-CM | POA: Diagnosis not present

## 2023-09-24 DIAGNOSIS — Z8701 Personal history of pneumonia (recurrent): Secondary | ICD-10-CM

## 2023-09-24 DIAGNOSIS — J189 Pneumonia, unspecified organism: Secondary | ICD-10-CM

## 2023-09-24 MED ORDER — AMOXICILLIN 875 MG PO TABS: 875.0000 mg | ORAL_TABLET | Freq: Two times a day (BID) | ORAL | 0 refills | Status: DC

## 2023-09-24 MED ORDER — AZITHROMYCIN 250 MG PO TABS: ORAL_TABLET | ORAL | 0 refills | Status: DC

## 2023-09-24 MED ORDER — HYDROCOD POLI-CHLORPHE POLI ER 10-8 MG/5ML PO SUER: 5.0000 mL | Freq: Two times a day (BID) | ORAL | 0 refills | Status: DC | PRN

## 2023-09-24 NOTE — ED Provider Notes (Signed)
 [facility]    CSN: [id number] Arrival date & time: 09/24/23  0807      History   Chief Complaint Chief Complaint  Patient presents with   Cough    Appt 815AM    HPI Daniel Bridges is a 53 y.o. male.   Patient states has had pneumonia 3 times.  Has had a cough now for 10 days and he is worried it may be pneumonia again.  He states when he takes a deep breath or coughs his chest hurts.  He feels short of breath.  He is coughing up scant sputum.  He is feeling tired.  No fever or chills.  No headache or bodyaches.  He did not did flu or COVID testing.    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 Authorizing Provider  amoxicillin  (AMOXIL ) 875 MG tablet Take 1 tablet (875 mg total) by mouth 2 (two) times daily. 09/24/23  Yes Maranda Jamee Jacob, MD  azithromycin  (ZITHROMAX  Z-PAK) 250 MG tablet Take two pills today followed by one a day until gone 09/24/23  Yes Maranda Jamee Jacob, MD  chlorpheniramine-HYDROcodone (TUSSIONEX) 10-8 MG/5ML Take 5 mLs by mouth every 12 (twelve) hours as needed for cough. 09/24/23  Yes Maranda Jamee Jacob, MD  atorvastatin (LIPITOR) 10 MG tablet Take 10 mg by mouth daily. 05/18/21   [provider]  Cholecalciferol (VITAMIN D) 50 MCG (2000 UT) tablet Take 2,000 Units by mouth daily.    [provider]  Fluoxetine HCl, PMDD, 20 MG TABS Take 1 tablet by mouth every morning.    [provider]  latanoprost (XALATAN) 0.005 % ophthalmic solution 1 drop at bedtime.    [provider]  lisinopril-hydrochlorothiazide (ZESTORETIC) 10-12.5 MG tablet Take 1 tablet by mouth daily.    [provider]  mesalamine  (ROWASA ) 4 g enema Place 60 mLs (4 g total) rectally every other day. Patient taking differently: Place 4 g rectally as needed. 07/20/19   Teressa Toribio SQUIBB, MD    Family History Family History  Problem Relation Age of Onset   Kidney cancer Mother    Hypertension Mother    Aneurysm Father        abdominal aortic   Colon cancer Paternal Aunt 61   Clotting disorder Paternal Uncle     Social History Social History   Tobacco Use   Smoking status: Never   Smokeless tobacco: Never  Vaping Use   Vaping status: Never Used  Substance Use Topics   Alcohol use: Yes    Alcohol/week: 14.0 standard drinks of alcohol    Types: 14 Cans of beer per week    Comment: 2 beers per day    Drug use: No     Allergies   Imuran  [azathioprine ]   Review of Systems Review of Systems See HPI  Physical Exam Triage Vital Signs ED Triage Vitals  Encounter Vitals Group     BP 09/24/23 0818 (!) 142/93     Systolic BP  Percentile --      Diastolic BP Percentile --      Pulse Rate 09/24/23 0818 90     Resp 09/24/23 0818 17     Temp 09/24/23 0818 98.4 F (36.9 C)     Temp Source 09/24/23 0818 Oral     SpO2 09/24/23 0818 99 %     Weight --      Height --      Head Circumference --      Peak Flow --      Pain Score 09/24/23 0819 0     Pain Loc --      Pain Education --      Exclude from Growth Chart --    No data found.  Updated Vital Signs BP (!) 142/93 (BP Location: Right Arm)   Pulse 90   Temp 98.4 F (36.9 C) (Oral)   Resp 17   SpO2 99%      Physical Exam Constitutional:      General: He is not in acute distress.    Appearance: He is well-developed and normal weight.  HENT:     Head: Normocephalic and atraumatic.     Right Ear: Tympanic membrane and ear canal normal.     Left Ear: Tympanic membrane and ear canal normal.      Nose: Nose normal. No rhinorrhea.     Mouth/Throat:     Mouth: Mucous membranes are moist.     Pharynx: No posterior oropharyngeal erythema.  Eyes:     Conjunctiva/sclera: Conjunctivae normal.     Pupils: Pupils are equal, round, and reactive to light.  Cardiovascular:     Rate and Rhythm: Normal rate.     Heart sounds: Normal heart sounds.  Pulmonary:     Effort: Pulmonary effort is normal. No respiratory distress.     Breath sounds: Rhonchi and rales present.     Comments: Few central rhonchi.  Rales right base Abdominal:     General: There is no distension.     Palpations: Abdomen is soft.  Musculoskeletal:        General: Normal range of motion.     Cervical back: Normal range of motion and neck supple.  Skin:    General: Skin is warm and dry.  Neurological:     Mental Status: He is alert.      UC Treatments / Results  Labs (all labs ordered are listed, but only abnormal results are displayed) Labs Reviewed - No data to display  EKG   Radiology DG Chest 2 View Result Date: 09/24/2023 CLINICAL DATA:  Cough. EXAM: CHEST - 2 VIEW COMPARISON:  Chest CT dated 02/26/2020. FINDINGS: The heart size and mediastinal contours are within normal limits. Both lungs are clear. The visualized skeletal structures are unremarkable. IMPRESSION: No active cardiopulmonary disease. Electronically Signed   By: Vanetta Chou M.D.   On: 09/24/2023 09:14    Procedures Procedures (including critical care time)  Medications Ordered in UC Medications - No data to display  Initial Impression / Assessment and Plan / UC Course  I have reviewed the triage vital signs and the nursing notes.  Pertinent labs & imaging results that were available during my care of the patient were reviewed by me and considered in my medical decision making (see chart for details).    Although the chest x-ray appears normal with the patient clinically has pneumonia based on symptoms and physical exam  findings Final Clinical Impressions(s) / UC Diagnoses  Final diagnoses:  Acute cough  Community acquired pneumonia of right lower lobe of lung  History of bacterial pneumonia     Discharge Instructions      Take the amoxicillin  2 times a day Take Z-Pak as directed.  2 pills today then 1 a day until gone Drink lots of fluids Get plenty of rest I have prescribed Tussionex for cough.  This is good at bedtime.  You may take it during the day but it can cause drowsiness See your doctor if not improving by next week     ED Prescriptions     Medication Sig Dispense Auth. Provider   amoxicillin  (AMOXIL ) 875 MG tablet Take 1 tablet (875 mg total) by mouth 2 (two) times daily. 14 tablet Maranda Jamee Jacob, MD   azithromycin  (ZITHROMAX  Z-PAK) 250 MG tablet Take two pills today followed by one a day until gone 6 tablet Maranda Jamee Jacob, MD   chlorpheniramine-HYDROcodone (TUSSIONEX) 10-8 MG/5ML Take 5 mLs by mouth every 12 (twelve) hours as needed for cough. 115 mL Maranda Jamee Jacob, MD      I have reviewed the PDMP during this encounter.   Maranda Jamee Jacob, MD 09/24/23 [phone]

## 2023-09-24 NOTE — Discharge Instructions (Signed)
 Take the amoxicillin  2 times a day Take Z-Pak as directed.  2 pills today then 1 a day until gone Drink lots of fluids Get plenty of rest I have prescribed Tussionex for cough.  This is good at bedtime.  You may take it during the day but it can cause drowsiness See your doctor if not improving by next week

## 2023-09-24 NOTE — ED Triage Notes (Signed)
 Pt c/o cough x 10 days. Aleve prn. Intermittent fever. Hx of pneumonia.

## 2024-03-09 ENCOUNTER — Encounter: Payer: Self-pay | Admitting: Gastroenterology

## 2024-03-09 ENCOUNTER — Ambulatory Visit (AMBULATORY_SURGERY_CENTER)

## 2024-03-09 VITALS — Ht 75.0 in | Wt 220.0 lb

## 2024-03-09 DIAGNOSIS — Z860100 Personal history of colon polyps, unspecified: Secondary | ICD-10-CM

## 2024-03-09 DIAGNOSIS — K5130 Ulcerative (chronic) rectosigmoiditis without complications: Secondary | ICD-10-CM

## 2024-03-09 MED ORDER — NA SULFATE-K SULFATE-MG SULF 17.5-3.13-1.6 GM/177ML PO SOLN
1.0000 | Freq: Once | ORAL | 0 refills | Status: AC
Start: 2024-03-09 — End: 2024-03-09

## 2024-03-09 NOTE — Progress Notes (Signed)

## 2024-03-24 ENCOUNTER — Encounter: Payer: Self-pay | Admitting: Gastroenterology

## 2024-03-24 ENCOUNTER — Ambulatory Visit: Admitting: Gastroenterology

## 2024-03-24 VITALS — BP 116/72 | HR 69 | Temp 97.7°F | Resp 14 | Ht 75.0 in | Wt 220.0 lb

## 2024-03-24 DIAGNOSIS — Z860101 Personal history of adenomatous and serrated colon polyps: Secondary | ICD-10-CM | POA: Diagnosis not present

## 2024-03-24 DIAGNOSIS — Z860100 Personal history of colon polyps, unspecified: Secondary | ICD-10-CM

## 2024-03-24 DIAGNOSIS — Z1211 Encounter for screening for malignant neoplasm of colon: Secondary | ICD-10-CM

## 2024-03-24 DIAGNOSIS — K5130 Ulcerative (chronic) rectosigmoiditis without complications: Secondary | ICD-10-CM

## 2024-03-24 MED ORDER — SODIUM CHLORIDE 0.9 % IV SOLN: 500.0000 mL | Freq: Once | INTRAVENOUS | Status: DC

## 2024-03-24 NOTE — Progress Notes (Signed)
 GASTROENTEROLOGY PROCEDURE H&P NOTE   Primary Care Physician: Nanci Senior, MD    Reason for Procedure:   Left sided UC, screening  Plan:    Colonoscopy  Patient is appropriate for endoscopic procedure(s) in the ambulatory ([facility]) setting.  The nature of the procedure, as well as the risks, benefits, and alternatives were carefully and thoroughly reviewed with the patient. Ample time for discussion and questions allowed. The patient understood, was satisfied, and agreed to proceed.     HPI: Daniel Bridges is a 53 y.o. male who presents for colonoscopy for ongoing screening/surveillance.   No longer taking any medications for UC. Last took Lialda  approximately 2 years ago. He is otherwise without any GI symptoms.   1. left sided ulcerative colitis. Dr. 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 Patient has been treated over the years with oral mesalamine  and intermittent mesalamine  suppositories. Flared with bleeding, urgency, frequency Spring 2013. Colonoscopy April 2013 showed moderate to severe inflammation up to 25 cm. Biopsies confirmed chronic inflammation. Terminal ileum was normal. He was started on 40 mg prednisone , mesalamine  enema (continued on mesalamine  orally). May, 2013: Symptoms improved very quickly on prednisone . 04/2013 Flare of symptoms beginning winder 2013/14; saw extender 03/2013; c. Diff negative, started Euceris, continued oral mesalamine ; 3 weeks later symptoms a bit worse and put on prednisone  40 with taper, continue oral mesalamine , added rowasa . 9/14 improving, prednisone  tapering. 09/2013 flaring again. Colonoscopy 10/2013 (done to restage disease given failure to respond to steroids as he usually did, showed moderated to severe inflammation up to 25cm, normal TI. Path  showed mildly active, chronic colitis, CMV stains negative. Also small TA was removed. TPTM testing level 10 (low metabolizer). 12/2013 very clearly intolerant of azathiaprine (flu like severe symptoms, reliably when he took it), stopped the med and started second mesalamine  trial (asacol  1600 tid). June, 2015 on asacol  dosing full-strength 3 times a day his symptoms completely resolved. Symptoms improved briefly only, he was back on prednisone  again. September 2015 he was initiated on Biologics, humira  10/2014 Flare after abx for pneumonia; empiric flagyl  did not help, C. Diff PCR neg, GI pathogen panel neg; repeat CT showed on distal disease still; steroids restarted, rowasa  restarted, eventual improvement. 11/2014 Humira  Anitbodies +++ (high titer) and no durg activity. Planned to switch to remicade however he was lost to follow up.  Several years lost to follow-up because he was feeling well, not taking any medicines.  Resurfaced late 2019 with flare-like symptoms.  Underwent colonoscopy January 2020.  Moderately inflamed colon up to 20 cm from the anus.  Also a single subcentimeter adenoma in the cecum.  Biopsies of the inflamed appearing colon showed chronic active colitis, biopsies of remaining colon were all normal.  Terminal ileum appeared normal. (05/2014 Hep B neg, TB skin test neg) Sed rate, CRP 08/2018 (during flare) were normal. 2. Tubular adenoma, subcentimeter colonoscopy 2015.  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 Authorizing Provider  atorvastatin (LIPITOR) 10 MG tablet Take 10  mg by mouth daily. 05/18/21  Yes [provider]  Cholecalciferol (VITAMIN D)  50 MCG (2000 UT) tablet Take 2,000 Units by mouth daily.   Yes [provider]  Fluoxetine HCl, PMDD, 20 MG TABS Take 1 tablet by mouth every morning.   Yes [provider]  latanoprost (XALATAN) 0.005 % ophthalmic solution 1 drop at bedtime.   Yes [provider]  lisinopril-hydrochlorothiazide (ZESTORETIC) 10-12.5 MG tablet Take 1 tablet by mouth daily.   Yes [provider]  LORazepam (ATIVAN) 0.5 MG tablet Take 0.5 mg by mouth every 6 (six) hours as needed. 09/24/23  Yes [provider]  Multiple Vitamins-Minerals (MULTI FOR HIM) TABS Take 1 tablet by mouth daily.   Yes [provider]  cetirizine (ZYRTEC) 10 MG chewable tablet Chew 10 mg by mouth as needed.    [provider]    Current Outpatient Medications  Medication Sig Dispense Refill   atorvastatin (LIPITOR) 10 MG tablet Take 10 mg by mouth daily.     Cholecalciferol (VITAMIN D) 50 MCG (2000 UT) tablet Take 2,000 Units by mouth daily.     Fluoxetine HCl, PMDD, 20 MG TABS Take 1 tablet by mouth every morning.     latanoprost (XALATAN) 0.005 % ophthalmic solution 1 drop at bedtime.     lisinopril-hydrochlorothiazide (ZESTORETIC) 10-12.5 MG tablet Take 1 tablet by mouth daily.     LORazepam (ATIVAN) 0.5 MG tablet Take 0.5 mg by mouth every 6 (six) hours as needed.     Multiple Vitamins-Minerals (MULTI FOR HIM) TABS Take 1 tablet by mouth daily.     cetirizine (ZYRTEC) 10 MG chewable tablet Chew 10 mg by mouth as needed.     Current Facility-Administered Medications  Medication Dose Route Frequency Provider Last Rate Last Admin   0.9 %  sodium chloride  infusion  500 mL Intravenous Once Donyelle Enyeart V, DO        Allergies as of 03/24/2024 - Review Complete 03/24/2024  Allergen Reaction Noted   Imuran  [azathioprine ]  12/25/2013    Family History  Problem Relation Age of Onset   Kidney cancer Mother    Hypertension Mother    Aneurysm Father        abdominal  aortic   Colon cancer Paternal Aunt 43   Clotting disorder Paternal Uncle    Rectal cancer Neg Hx    Stomach cancer Neg Hx    Esophageal cancer Neg Hx     Social History   Socioeconomic History   Marital status: Married    Spouse name: Not on file   Number of children: 2   Years of education: Not on file   Highest education level: Not on file  Occupational History   Occupation: Visual merchandiser  Tobacco Use   Smoking status: Never   Smokeless tobacco: Never  Vaping Use   Vaping status: Never Used  Substance and Sexual Activity   Alcohol use: Yes    Alcohol/week: 14.0 standard drinks of alcohol    Types: 14 Cans of beer per week    Comment: 2 beers per day    Drug use: No   Sexual activity: Not on file  Other Topics Concern   Not on file  Social History Narrative   Not on file   Social Drivers of Health   Financial Resource Strain: Not on file  Food Insecurity: Not on file  Transportation Needs: Not on file  Physical Activity: Not on file  Stress: Not on file  Social Connections: Not on file  Intimate Partner Violence: Not on file    Physical Exam: Vital signs in last 24 hours: @BP  (!) 152/90   Pulse 64   Temp 97.7 F (36.5 C) (Temporal)   Ht 6' 3 (1.905 m)   Wt 220 lb (99.8 kg)   SpO2 99%   BMI 27.50 kg/m  GEN: NAD EYE: Sclerae anicteric ENT: MMM CV: Non-tachycardic Pulm: CTA b/l GI: Soft, NT/ND NEURO:  Alert & Oriented x 3   Sandor Flatter, DO [facility]   03/24/2024 8:43 AM

## 2024-03-24 NOTE — Op Note (Signed)
 [facility] Patient Name: Daniel Bridges Procedure Date: 03/24/2024 8:34 AM MRN: [id number] Endoscopist: Sandor Flatter , MD, [id number] Age: 53 Referring MD:  Date of Birth: 04/20/1971 Gender: Male Account #: [id number] Procedure:                Colonoscopy Indications:              Surveillance: Personal history of adenomatous                            polyps on last colonoscopy 5 years ago                           Follow-up of left-sided chronic ulcerative colitis                           History of left-sided ulcerative colitis. Currently                            in clinical remission and stopped taking Lialda                             approximately 2 years ago without any breakthrough                            symptoms. Not currently on any IBD therapy. Medicines:                Monitored Anesthesia Care Procedure:                Pre-Anesthesia Assessment:                           - Prior to the procedure, a History and Physical                            was performed, and patient medications and                            allergies were reviewed. The patient's tolerance of                            previous anesthesia was also reviewed. The risks                            and benefits of the procedure and the sedation                            options and risks were discussed with the patient.                            All questions were answered, and informed consent                            was obtained. Prior Anticoagulants: The patient has  taken no anticoagulant or antiplatelet agents. ASA                            Grade Assessment: II - A patient with mild systemic                            disease. After reviewing the risks and benefits,                            the patient was deemed in satisfactory condition to                            undergo the procedure.                           After obtaining informed  consent, the colonoscope                            was passed under direct vision. Throughout the                            procedure, the patient's blood pressure, pulse, and                            oxygen saturations were monitored continuously. The                            CF [id number] #[id number] was introduced through the anus                            and advanced to the the terminal ileum. The                            colonoscopy was performed without difficulty. The                            patient tolerated the procedure well. The quality                            of the bowel preparation was good. The terminal                            ileum, ileocecal valve, appendiceal orifice, and                            rectum were photographed. Scope In: 8:51:56 AM Scope Out: 9:08:49 AM Scope Withdrawal Time: 0 hours 14 minutes 40 seconds  Total Procedure Duration: 0 hours 16 minutes 53 seconds  Findings:                 The perianal and digital rectal examinations were                            normal.  There was subtle inflammatory change from the                            rectum to 20 cm from the anal verge. This is                            characterized by mild edema with minimally altered                            vascularity. No aphthous ulcers, erythema,                            friability. Otherwise, the entire colon is                            normal-appearing. Biopsies were taken throughout                            the colon with a cold forceps for histology.                            Estimated blood loss was minimal.                           The retroflexed view of the distal rectum and anal                            verge was normal and showed no anal or rectal                            abnormalities.                           The terminal ileum appeared normal. Complications:            No immediate complications. Estimated  Blood Loss:     Estimated blood loss was minimal. Impression:               - The entire examined colon is normal. Biopsied.                           - The distal rectum and anal verge are normal on                            retroflexion view.                           - The examined portion of the ileum was normal. Recommendation:           - Patient has a contact number available for                            emergencies. The signs and symptoms of potential  delayed complications were discussed with the                            patient. Return to normal activities tomorrow.                            Written discharge instructions were provided to the                            patient.                           - Resume previous diet.                           - Continue present medications.                           - Await pathology results.                           - Repeat colonoscopy for surveillance based on                            pathology results.                           - Return to GI office PRN. Sandor Flatter, MD 03/24/2024 9:15:09 AM

## 2024-03-24 NOTE — Progress Notes (Signed)
 Pt's states no medical or surgical changes since previsit or office visit.

## 2024-03-24 NOTE — Progress Notes (Signed)
 To pacu, VSS. Report to [email or web address]

## 2024-03-24 NOTE — Patient Instructions (Signed)
-  await pathology results. -repeat colonoscopy for surveillance recommended. Date to be determined when pathology result become available.  -Continue present medications.  YOU HAD AN ENDOSCOPIC PROCEDURE TODAY AT THE [facility]:   Refer to the procedure report that was given to you for any specific questions about what was found during the examination.  If the procedure report does not answer your questions, please call your gastroenterologist to clarify.  If you requested that your care partner not be given the details of your procedure findings, then the procedure report has been included in a sealed envelope for you to review at your convenience later.  YOU SHOULD EXPECT: Some feelings of bloating in the abdomen. Passage of more gas than usual.  Walking can help get rid of the air that was put into your GI tract during the procedure and reduce the bloating. If you had a lower endoscopy (such as a colonoscopy or flexible sigmoidoscopy) you may notice spotting of blood in your stool or on the toilet paper. If you underwent a bowel prep for your procedure, you may not have a normal bowel movement for a few days.  Please Note:  You might notice some irritation and congestion in your nose or some drainage.  This is from the oxygen used during your procedure.  There is no need for concern and it should clear up in a day or so.  SYMPTOMS TO REPORT IMMEDIATELY:  Following lower endoscopy (colonoscopy or flexible sigmoidoscopy):  Excessive amounts of blood in the stool  Significant tenderness or worsening of abdominal pains  Swelling of the abdomen that is new, acute  Fever of 100F or higher  For urgent or emergent issues, a gastroenterologist can be reached at any hour by calling [phone]. Do not use MyChart messaging for urgent concerns.    DIET:  We do recommend a small meal at first, but then you may proceed to your regular diet.  Drink plenty of fluids but you should avoid  alcoholic beverages for 24 hours.  ACTIVITY:  You should plan to take it easy for the rest of today and you should NOT DRIVE or use heavy machinery until tomorrow (because of the sedation medicines used during the test).    FOLLOW UP: Our staff will call the number listed on your records the next business day following your procedure.  We will call around 7:15- 8:00 am to check on you and address any questions or concerns that you may have regarding the information given to you following your procedure. If we do not reach you, we will leave a message.     If any biopsies were taken you will be contacted by phone or by letter within the next 1-3 weeks.  Please call us  at [phone] if you have not heard about the biopsies in 3 weeks.    SIGNATURES/CONFIDENTIALITY: You and/or your care partner have signed paperwork which will be entered into your electronic medical record.  These signatures attest to the fact that that the information above on your After Visit Summary has been reviewed and is understood.  Full responsibility of the confidentiality of this discharge information lies with you and/or your care-partner.

## 2024-03-24 NOTE — Progress Notes (Signed)
 Called to room to assist during endoscopic procedure.  Patient ID and intended procedure confirmed with present staff. Received instructions for my participation in the procedure from the performing physician.

## 2024-03-25 ENCOUNTER — Telehealth: Payer: Self-pay | Admitting: *Deleted

## 2024-03-25 NOTE — Telephone Encounter (Signed)
  Follow up Call-     03/24/2024    7:35 AM  Call back number  Post procedure Call Back phone  # [phone]  Permission to leave phone message Yes     Patient questions:  Do you have a fever, pain , or abdominal swelling? No. Pain Score  0 *  Have you tolerated food without any problems? Yes.    Have you been able to return to your normal activities? Yes.    Do you have any questions about your discharge instructions: Diet   No. Medications  No. Follow up visit  No.  Do you have questions or concerns about your Care? No.  Actions: * If pain score is 4 or above: No action needed, pain <4.

## 2024-03-26 LAB — SURGICAL PATHOLOGY

## 2024-04-08 ENCOUNTER — Ambulatory Visit: Payer: Self-pay | Admitting: Gastroenterology
